# Patient Record
Sex: Female | Born: 2000 | Race: Black or African American | Hispanic: No | Marital: Single | Smoking: Current every day smoker
Health system: Southern US, Community
[De-identification: ages and names within clinical notes are randomized; demographics above are authoritative.]

## PROBLEM LIST (undated history)

## (undated) DIAGNOSIS — R4689 Other symptoms and signs involving appearance and behavior: Secondary | ICD-10-CM

## (undated) DIAGNOSIS — Z789 Other specified health status: Secondary | ICD-10-CM

## (undated) HISTORY — PX: NO PAST SURGERIES: SHX2092

---
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
## 2019-05-14 ENCOUNTER — Emergency Department (HOSPITAL_COMMUNITY)
Admission: EM | Admit: 2019-05-14 | Discharge: 2019-05-14 | Disposition: A | Discharge status: Home / Self Care | Payer: Medicaid Other | Attending: Emergency Medicine | Admitting: Emergency Medicine

## 2019-05-14 ENCOUNTER — Encounter (HOSPITAL_COMMUNITY): Payer: Self-pay

## 2019-05-14 DIAGNOSIS — Z79899 Other long term (current) drug therapy: Secondary | ICD-10-CM | POA: Diagnosis not present

## 2019-05-14 DIAGNOSIS — J020 Streptococcal pharyngitis: Secondary | ICD-10-CM | POA: Diagnosis not present

## 2019-05-14 DIAGNOSIS — R509 Fever, unspecified: Secondary | ICD-10-CM | POA: Diagnosis present

## 2019-05-14 MED ORDER — DEXAMETHASONE 4 MG PO TABS
10.0000 mg | ORAL_TABLET | Freq: Once | ORAL | Status: AC
  Administered 2019-05-14: 10 mg via ORAL
  Filled 2019-05-14: qty 2

## 2019-05-14 MED ORDER — PENICILLIN G BENZATHINE 1200000 UNIT/2ML IM SUSP
1.2000 10*6.[IU] | Freq: Once | INTRAMUSCULAR | Status: AC
  Administered 2019-05-14: 1.2 10*6.[IU] via INTRAMUSCULAR
  Filled 2019-05-14: qty 2

## 2019-05-14 NOTE — Discharge Instructions (Signed)
Please follow-up with an ear nose and throat doctor if you have had this issue going on for many months and occurs every couple weeks.  They may decide to take her tonsils out or may think this is caused by something else.

## 2019-05-14 NOTE — ED Triage Notes (Signed)
Pt states she's had a fever since yesterday, the highest she states was 105, pt says that she took tylenol prior to coming to the ED

## 2019-05-14 NOTE — ED Provider Notes (Signed)
[facility] DEPT Provider Note   CSN: [id number] Arrival date & time: 05/14/19  0122    History   Chief Complaint Chief Complaint  Patient presents with  . Fever    HPI Brianna Lloyd is a 18 y.o. female.     18 yo F with a cc of sore throat.  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  States that she gets a severe infection about every 2 weeks.  Gets to the point where she needs to seek medical care about twice a year.  Had fever at home as high as 105.  Took Tylenol with some improvement.  Denies anyone nearby with similar illness.  Denies cough or congestion.  Denies vomiting or diarrhea.  Feels worse on the left than the right.  The history is provided by the patient.  Fever Associated symptoms: no chest pain, no chills, no congestion, no dysuria, no headaches, no myalgias, no nausea, no rhinorrhea and no vomiting   Illness Severity:  Moderate Onset quality:  Gradual Duration:  2 days Timing:  Constant Progression:  Worsening Chronicity:  Recurrent Associated symptoms: fever   Associated symptoms: no chest pain, no congestion, no headaches, no myalgias, no nausea, no rhinorrhea, no shortness of breath, no vomiting and no wheezing     History reviewed. No pertinent past medical history.  Patient Active Problem List   Diagnosis Date Noted  . Aggressive behavior of adolescent   . PTSD (post-traumatic stress disorder) 01/20/2016    History reviewed. No pertinent surgical history.   OB History   No obstetric history on file.      Home Medications    Prior to Admission medications   Medication Sig Start Date End Date Taking? Authorizing Provider  acetaminophen (TYLENOL) 325 MG tablet Take 2 tablets (650 mg total) by mouth every 4 (four) hours as needed for mild pain or moderate pain. 02/03/16   Orlie Dakin, MD  ammonium lactate (AMLACTIN) 12 % lotion Apply 1 application topically as needed for dry  skin. 02/03/16   Orlie Dakin, MD  amphetamine-dextroamphetamine (ADDERALL XR) 15 MG 24 hr capsule Take 1 capsule by mouth daily with breakfast. 02/03/16   Orlie Dakin, MD  calcium & magnesium carbonates (MYLANTA) 601-093 MG tablet Take 1 tablet by mouth 2 (two) times daily as needed for heartburn. 02/03/16   Orlie Dakin, MD  cetirizine (ZYRTEC) 10 MG tablet Take 10 mg by mouth daily.    [provider]  Cholecalciferol (VITAMIN D3) 2000 units capsule Take 2,000 Units by mouth daily.    [provider]  FLUoxetine (PROZAC) 20 MG capsule Take 1 capsule (20 mg total) by mouth daily. 02/03/16   Orlie Dakin, MD  Halobetasol-Ammonium Lactate 0.05 & 12 % (Cream) KIT Apply 1 application topically 2 (two) times daily.    [provider]  lidocaine (XYLOCAINE) 2 % solution Use as directed 15 mLs in the mouth or throat as needed for mouth pain. 06/19/18   Larene Pickett, PA-C  risperiDONE (RISPERDAL) 0.5 MG tablet Take 1 tablet (0.5 mg total) by mouth daily. 02/03/16   Orlie Dakin, MD  tretinoin (RETIN-A) 0.025 % cream Apply 1 application topically at bedtime.    [provider]  triamcinolone cream (KENALOG) 0.1 % Apply 1 application topically 2 (two) times daily.    [provider]    Family History History reviewed. No pertinent family history.  Social History Social History   Tobacco Use  .  Smoking status: Never Smoker  . Smokeless tobacco: Never Used  Substance Use Topics  . Alcohol use: Yes  . Drug use: Yes    Types: Marijuana     Allergies   Patient has no known allergies.   Review of Systems Review of Systems  Constitutional: Positive for fever. Negative for chills.  HENT: Negative for congestion and rhinorrhea.   Eyes: Negative for redness and visual disturbance.  Respiratory: Negative for shortness of breath and wheezing.   Cardiovascular: Negative for chest pain and palpitations.  Gastrointestinal: Negative for nausea  and vomiting.  Genitourinary: Negative for dysuria and urgency.  Musculoskeletal: Negative for arthralgias and myalgias.  Skin: Negative for pallor and wound.  Neurological: Negative for dizziness and headaches.     Physical Exam Updated Vital Signs BP 118/74 (BP Location: Left Arm)   Pulse 89   Temp (!) 100.9 F (38.3 C) (Oral)   Resp 15   Ht 5' (1.524 m)   Wt 59 kg   SpO2 100%   BMI 25.39 kg/m   Physical Exam Vitals signs and nursing note reviewed.  Constitutional:      General: She is not in acute distress.    Appearance: She is well-developed. She is not diaphoretic.  HENT:     Head: Normocephalic and atraumatic.     Comments: Tonsillar swelling and exudates worse on the right than the left.  Left anterior cervical lymphadenopathy.  Tolerating secretions without difficulty.  No significant posterior oropharyngeal erythema or edema.  Epiglottis is visualized. Eyes:     Pupils: Pupils are equal, round, and reactive to light.  Neck:     Musculoskeletal: Normal range of motion and neck supple.  Cardiovascular:     Rate and Rhythm: Normal rate and regular rhythm.     Heart sounds: No murmur. No friction rub. No gallop.   Pulmonary:     Effort: Pulmonary effort is normal.     Breath sounds: No wheezing or rales.  Abdominal:     General: There is no distension.     Palpations: Abdomen is soft.     Tenderness: There is no abdominal tenderness.  Musculoskeletal:        General: No tenderness.  Skin:    General: Skin is warm and dry.  Neurological:     Mental Status: She is alert and oriented to person, place, and time.  Psychiatric:        Behavior: Behavior normal.      ED Treatments / Results  Labs (all labs ordered are listed, but only abnormal results are displayed) Labs Reviewed - No data to display  EKG None  Radiology No results found.  Procedures Procedures (including critical care time)  Medications Ordered in ED Medications  penicillin g  benzathine (BICILLIN LA) 1200000 UNIT/2ML injection 1.2 Million Units (has no administration in time range)  dexamethasone (DECADRON) tablet 10 mg (has no administration in time range)     Initial Impression / Assessment and Plan / ED Course  I have reviewed the triage vital signs and the nursing notes.  Pertinent labs & imaging results that were available during my care of the patient were reviewed by me and considered in my medical decision making (see chart for details).        18 yo F with a chief complaint of a fever.  Patient has recurrent episodes where she gets a sore throat and a fever.  She states that she seeks medical care about twice a year  but about every 2 weeks has pretty significant pain with it.  Has never seen an ear nose and throat physician.  I will treat for strep pharyngitis as the patient has 3/4 centor criteria.  Given ENT follow-up as needed.  1:59 AM:  I have discussed the diagnosis/risks/treatment options with the patient and believe the pt to be eligible for discharge home to follow-up with ENT, PCP. We also discussed returning to the ED immediately if new or worsening sx occur. We discussed the sx which are most concerning (e.g., sudden worsening pain, fever, inability to tolerate by mouth) that necessitate immediate return. Medications administered to the patient during their visit and any new prescriptions provided to the patient are listed below.  Medications given during this visit Medications  penicillin g benzathine (BICILLIN LA) 1200000 UNIT/2ML injection 1.2 Million Units (has no administration in time range)  dexamethasone (DECADRON) tablet 10 mg (has no administration in time range)     The patient appears reasonably screen and/or stabilized for discharge and I doubt any other medical condition or other [facility] requiring further screening, evaluation, or treatment in the ED at this time prior to discharge.    Final Clinical Impressions(s) / ED Diagnoses    Final diagnoses:  Strep pharyngitis    ED Discharge Orders    None       Deno Etienne, DO 05/14/19 0159

## 2019-11-03 ENCOUNTER — Other Ambulatory Visit: Payer: Self-pay | Admitting: *Deleted

## 2019-11-03 DIAGNOSIS — Z20822 Contact with and (suspected) exposure to covid-19: Secondary | ICD-10-CM

## 2019-11-04 LAB — NOVEL CORONAVIRUS, NAA: SARS-CoV-2, NAA: NOT DETECTED

## 2019-12-06 ENCOUNTER — Encounter: Payer: Medicaid Other | Admitting: Advanced Practice Midwife

## 2019-12-12 ENCOUNTER — Other Ambulatory Visit: Payer: Self-pay

## 2019-12-12 ENCOUNTER — Encounter: Payer: Self-pay | Admitting: Emergency Medicine

## 2019-12-12 ENCOUNTER — Emergency Department
Admission: EM | Admit: 2019-12-12 | Discharge: 2019-12-12 | Disposition: A | Discharge status: Home / Self Care | Payer: Medicaid Other | Attending: Emergency Medicine | Admitting: Emergency Medicine

## 2019-12-12 DIAGNOSIS — U071 COVID-19: Secondary | ICD-10-CM | POA: Diagnosis not present

## 2019-12-12 DIAGNOSIS — Z79899 Other long term (current) drug therapy: Secondary | ICD-10-CM | POA: Diagnosis not present

## 2019-12-12 DIAGNOSIS — Z1152 Encounter for screening for COVID-19: Secondary | ICD-10-CM

## 2019-12-12 DIAGNOSIS — R430 Anosmia: Secondary | ICD-10-CM | POA: Diagnosis present

## 2019-12-12 NOTE — ED Triage Notes (Addendum)
States she needs COVID testing  States she has lost her taste  No fever or other sx's

## 2019-12-12 NOTE — ED Notes (Signed)
Topaz pad not working. Pt verbalized understanding discharge instructions and follow ups. Pt ambulated to lobby in no distress.

## 2019-12-12 NOTE — ED Provider Notes (Signed)
[facility] Emergency Department Provider Note  ____________________________________________  Time seen: Approximately 4:36 PM  I have reviewed the triage vital signs and the nursing notes.   HISTORY  Chief Complaint URI (wants COVID testing)    HPI Brianna Lloyd is a 19 y.o. female that presents to the emergency department requesting Covid testing.  Patient states that she noticed that she could not smell or taste today.  She denies any additional symptoms.  Patient states that she has an occasional cough which is usual for her because she smokes marijuana.  No contacts with COVID-19.  She feels well overall.  No fever, nasal congestion, sore throat, vomiting, diarrhea.   History reviewed. No pertinent past medical history.  Patient Active Problem List   Diagnosis Date Noted  . Aggressive behavior of adolescent   . PTSD (post-traumatic stress disorder) 01/20/2016    History reviewed. No pertinent surgical history.  Prior to Admission medications   Medication Sig Start Date End Date Taking? Authorizing Provider  acetaminophen (TYLENOL) 325 MG tablet Take 2 tablets (650 mg total) by mouth every 4 (four) hours as needed for mild pain or moderate pain. 02/03/16   Orlie Dakin, MD  ammonium lactate (AMLACTIN) 12 % lotion Apply 1 application topically as needed for dry skin. 02/03/16   Orlie Dakin, MD  amphetamine-dextroamphetamine (ADDERALL XR) 15 MG 24 hr capsule Take 1 capsule by mouth daily with breakfast. 02/03/16   Orlie Dakin, MD  calcium & magnesium carbonates (MYLANTA) 157-262 MG tablet Take 1 tablet by mouth 2 (two) times daily as needed for heartburn. 02/03/16   Orlie Dakin, MD  cetirizine (ZYRTEC) 10 MG tablet Take 10 mg by mouth daily.    [provider]  Cholecalciferol (VITAMIN D3) 2000 units capsule Take 2,000 Units by mouth daily.    [provider]  FLUoxetine (PROZAC) 20 MG capsule Take 1 capsule (20 mg total) by  mouth daily. 02/03/16   Orlie Dakin, MD  Halobetasol-Ammonium Lactate 0.05 & 12 % (Cream) KIT Apply 1 application topically 2 (two) times daily.    [provider]  lidocaine (XYLOCAINE) 2 % solution Use as directed 15 mLs in the mouth or throat as needed for mouth pain. 06/19/18   Larene Pickett, PA-C  risperiDONE (RISPERDAL) 0.5 MG tablet Take 1 tablet (0.5 mg total) by mouth daily. 02/03/16   Orlie Dakin, MD  tretinoin (RETIN-A) 0.025 % cream Apply 1 application topically at bedtime.    [provider]  triamcinolone cream (KENALOG) 0.1 % Apply 1 application topically 2 (two) times daily.    [provider]    Allergies Patient has no known allergies.  No family history on file.  Social History Social History   Tobacco Use  . Smoking status: Never Smoker  . Smokeless tobacco: Never Used  Substance Use Topics  . Alcohol use: Yes  . Drug use: Yes    Types: Marijuana     Review of Systems  Constitutional: No fever/chills ENT: No upper respiratory complaints. Cardiovascular: No chest pain. Respiratory: No new cough. No SOB. Gastrointestinal: No abdominal pain.  No nausea, no vomiting.  Musculoskeletal: Negative for musculoskeletal pain. Skin: Negative for rash, abrasions, lacerations, ecchymosis. Neurological: Negative for headaches   ____________________________________________   PHYSICAL EXAM:  VITAL SIGNS: ED Triage Vitals [12/12/19 1616]  Enc Vitals Group     BP      Pulse      Resp      Temp  Temp src      SpO2      Weight      Height      Head Circumference      Peak Flow      Pain Score 0     Pain Loc      Pain Edu?      Excl. in [place]?      Constitutional: Alert and oriented. Well appearing and in no acute distress. Eyes: Conjunctivae are normal. PERRL. EOMI. Head: Atraumatic. ENT:      Ears:      Nose: No congestion/rhinnorhea.      Mouth/Throat: Mucous membranes are moist.  Neck: No  stridor. Cardiovascular: Normal rate, regular rhythm.  Good peripheral circulation. Respiratory: Normal respiratory effort without tachypnea or retractions. Lungs CTAB. Good air entry to the bases with no decreased or absent breath sounds. Musculoskeletal: Full range of motion to all extremities. No gross deformities appreciated. Neurologic:  Normal speech and language. No gross focal neurologic deficits are appreciated.  Skin:  Skin is warm, dry and intact. No rash noted. Psychiatric: Mood and affect are normal. Speech and behavior are normal. Patient exhibits appropriate insight and judgement.   ____________________________________________   LABS (all labs ordered are listed, but only abnormal results are displayed)  Labs Reviewed  SARS CORONAVIRUS 2 (TAT 6-24 HRS)   ____________________________________________  EKG   ____________________________________________  RADIOLOGY  No results found.  ____________________________________________    PROCEDURES  Procedure(s) performed:    Procedures    Medications - No data to display   ____________________________________________   INITIAL IMPRESSION / ASSESSMENT AND PLAN / ED COURSE  Pertinent labs & imaging results that were available during my care of the patient were reviewed by me and considered in my medical decision making (see chart for details).  Review of the [place] CSRS was performed in accordance of the [place] prior to dispensing any controlled drugs.     Patient presented to the emergency department for Covid screening.  Vital signs and exam are reassuring.  Covid test is pending.   Patient is to follow up with primary care as directed. Patient is given ED precautions to return to the ED for any worsening or new symptoms.  Heleena Bromwell was evaluated in Emergency Department on 12/12/2019 for the symptoms described in the history of present illness. She was evaluated in the context of the global COVID-19  pandemic, which necessitated consideration that the patient might be at risk for infection with the SARS-CoV-2 virus that causes COVID-19. Institutional protocols and algorithms that pertain to the evaluation of patients at risk for COVID-19 are in a state of rapid change based on information released by regulatory bodies including the CDC and federal and state organizations. These policies and algorithms were followed during the patient's care in the ED.   ____________________________________________  FINAL CLINICAL IMPRESSION(S) / ED DIAGNOSES  Final diagnoses:  Encounter for screening for COVID-19      NEW MEDICATIONS STARTED DURING THIS VISIT:  ED Discharge Orders    None          This chart was dictated using voice recognition software/Dragon. Despite best efforts to proofread, errors can occur which can change the meaning. Any change was purely unintentional.    Laban Emperor, PA-C 12/12/19 1743    Harvest Dark, MD 12/12/19 [phone]

## 2019-12-13 ENCOUNTER — Telehealth: Payer: Self-pay | Admitting: *Deleted

## 2019-12-13 LAB — SARS CORONAVIRUS 2 (TAT 6-24 HRS): SARS Coronavirus 2: POSITIVE — AB

## 2019-12-13 NOTE — Telephone Encounter (Signed)
Pt given result of COVID test obtained 2 /23/21 at [facility]; tier or symptoms reviewed; instructions given to pt . remain in self-quarantine until they meet the "Non-Test Criteria for Ending Self-Isolation". Non-Test Criteria for Ending Self-Isolation All persons with fever and respiratory symptoms should isolate themselves until ALL conditions listed below are met: - at least 10 days since symptoms onset - AND 3 consecutive days fever free without antipyretics (acetaminophen [Tylenol] or ibuprofen [Advil]) - AND improvement in respiratory symptoms . If the patient develops respiratory issues/distress, seek medical care in the Emergency Department, call 911, reports symptoms and report COVID-19 positive test. Patient Instructions . continue to utilize over-the-counter medications for fever (ibuprofen and/or Tylenol) and cough (cough medicine and/or sore throat lozenges). .  wear a mask around people and follow good infection prevention techniques. . Patient to should only leave home to seek medical care. . send family for food, prescriptions or medicines; or use delivery service.  . If the patient must leave the home, they must wear a mask in public. Marland Kitchen limit contact with immediate family members or caregivers in the home, and use mask, social distancing, and handwashing to decrease risk to patients. o Please continue good preventive care measures, including frequent hand washing, avoid touching your face, cover coughs/sneezes with tissue or into elbow, stay out of crowds and keep a 6-foot distance from others.   . patient and family to clean hard surfaces touched by patient frequently with household cleaning products. Pt advised to notify her PCP; pt also informed the [facility] Dept was notified; she verbalized understanding.

## 2020-01-04 ENCOUNTER — Encounter: Payer: Medicaid Other | Admitting: Advanced Practice Midwife

## 2020-03-25 ENCOUNTER — Telehealth: Payer: Medicaid Other

## 2020-05-23 ENCOUNTER — Ambulatory Visit
Admission: EM | Admit: 2020-05-23 | Discharge: 2020-05-23 | Disposition: A | Discharge status: Home / Self Care | Payer: Medicaid Other | Attending: Emergency Medicine | Admitting: Emergency Medicine

## 2020-05-23 ENCOUNTER — Other Ambulatory Visit: Payer: Self-pay

## 2020-05-23 DIAGNOSIS — Z3490 Encounter for supervision of normal pregnancy, unspecified, unspecified trimester: Secondary | ICD-10-CM

## 2020-05-23 DIAGNOSIS — R102 Pelvic and perineal pain unspecified side: Secondary | ICD-10-CM

## 2020-05-23 DIAGNOSIS — B373 Candidiasis of vulva and vagina: Secondary | ICD-10-CM | POA: Insufficient documentation

## 2020-05-23 DIAGNOSIS — B3731 Acute candidiasis of vulva and vagina: Secondary | ICD-10-CM

## 2020-05-23 DIAGNOSIS — R112 Nausea with vomiting, unspecified: Secondary | ICD-10-CM | POA: Diagnosis not present

## 2020-05-23 LAB — CBC WITH DIFFERENTIAL/PLATELET
Abs Immature Granulocytes: 0.02 10*3/uL (ref 0.00–0.07)
Basophils Absolute: 0 10*3/uL (ref 0.0–0.1)
Basophils Relative: 0 %
Eosinophils Absolute: 0 10*3/uL (ref 0.0–0.5)
Eosinophils Relative: 0 %
HCT: 38.8 % (ref 36.0–46.0)
Hemoglobin: 12.6 g/dL (ref 12.0–15.0)
Immature Granulocytes: 0 %
Lymphocytes Relative: 24 %
Lymphs Abs: 1.5 10*3/uL (ref 0.7–4.0)
MCH: 29.3 pg (ref 26.0–34.0)
MCHC: 32.5 g/dL (ref 30.0–36.0)
MCV: 90.2 fL (ref 80.0–100.0)
Monocytes Absolute: 0.4 10*3/uL (ref 0.1–1.0)
Monocytes Relative: 6 %
Neutro Abs: 4.4 10*3/uL (ref 1.7–7.7)
Neutrophils Relative %: 70 %
Platelets: 357 10*3/uL (ref 150–400)
RBC: 4.3 MIL/uL (ref 3.87–5.11)
RDW: 12.6 % (ref 11.5–15.5)
WBC: 6.3 10*3/uL (ref 4.0–10.5)
nRBC: 0 % (ref 0.0–0.2)

## 2020-05-23 LAB — COMPREHENSIVE METABOLIC PANEL
ALT: 16 U/L (ref 0–44)
AST: 20 U/L (ref 15–41)
Albumin: 4.8 g/dL (ref 3.5–5.0)
Alkaline Phosphatase: 65 U/L (ref 38–126)
Anion gap: 13 (ref 5–15)
BUN: 11 mg/dL (ref 6–20)
CO2: 22 mmol/L (ref 22–32)
Calcium: 9.7 mg/dL (ref 8.9–10.3)
Chloride: 98 mmol/L (ref 98–111)
Creatinine, Ser: 0.64 mg/dL (ref 0.44–1.00)
GFR calc Af Amer: >60 mL/min (ref >60)
GFR calc non Af Amer: >60 mL/min (ref >60)
Glucose, Bld: 94 mg/dL (ref 70–99)
Potassium: 3.8 mmol/L (ref 3.5–5.1)
Sodium: 133 mmol/L — ABNORMAL LOW (ref 135–145)
Total Bilirubin: 0.8 mg/dL (ref 0.3–1.2)
Total Protein: 8.7 g/dL — ABNORMAL HIGH (ref 6.5–8.1)

## 2020-05-23 LAB — PREGNANCY, URINE: Preg Test, Ur: POSITIVE — AB

## 2020-05-23 LAB — WET PREP, GENITAL
Clue Cells Wet Prep HPF POC: NONE SEEN
Sperm: NONE SEEN
Trich, Wet Prep: NONE SEEN

## 2020-05-23 LAB — URINALYSIS, COMPLETE (UACMP) WITH MICROSCOPIC
Glucose, UA: NEGATIVE mg/dL
Ketones, ur: >160 mg/dL — AB
Leukocytes,Ua: NEGATIVE
Nitrite: NEGATIVE
Protein, ur: 100 mg/dL — AB
Specific Gravity, Urine: >1.03 — ABNORMAL HIGH (ref 1.005–1.030)
pH: 6 (ref 5.0–8.0)

## 2020-05-23 LAB — LIPASE, BLOOD: Lipase: 37 U/L (ref 11–51)

## 2020-05-23 LAB — HCG, QUANTITATIVE, PREGNANCY: hCG, Beta Chain, Quant, S: 44599 m[IU]/mL — ABNORMAL HIGH (ref <5)

## 2020-05-23 NOTE — ED Triage Notes (Signed)
Patient complains of nausea and vomiting x 3 days with chills, fatigue and body aches. Patient states that she is unable to keep anything down.

## 2020-05-23 NOTE — ED Provider Notes (Signed)
HPI  SUBJECTIVE:  Brianna Lloyd is a 19 y.o. female who presents with 3 days of nausea, vomiting starting after a night of heavy drinking.  Reports 3 episodes of nonbilious nonbloody emesis for the past several days, states that she is keeping fluids down, but is unable to tolerate any food.  She reports generalized weakness and low abdominal/pelvic pain described as throbbing, stabbing, sore and constant.  It has not changed since it started 3 days ago.  She reports 1-2 episodes of watery, nonbloody diarrhea per day.  No fevers.  She reports cloudy urine, body aches.  She tried Pepto-Bismol and eating.  Symptoms are better after vomiting and having a bowel movement, with lying down still, symptoms are worse before vomiting or having a bowel movement and with moving around.  No change in urine output, dysuria, urinary urgency, frequency, odorous urine, hematuria.  No back pain.  No vaginal odor, bleeding, discharge.  She is in a monogamous relationship with a female, however STDs are a concern today.  She had Covid earlier this year.  She smokes marijuana daily, but has not smoked in 3 days.  She has a history of UTI, HSV.  No history of diabetes, hypertension, alcohol abuse, pancreatitis, pyelonephritis, gonorrhea, chlamydia, HIV, syphilis, trichomonas, BV, yeast, PID.  She has never been pregnant.  LMP: Late June.  States that she missed July.  She is normally very regular.  PMD: None.    History reviewed. No pertinent past medical history.  Past Surgical History:  Procedure Laterality Date  . NO PAST SURGERIES      Family History  Problem Relation Age of Onset  . Schizophrenia Mother   . Alcohol abuse Mother   . Schizophrenia Father   . Alcohol abuse Father     Social History   Tobacco Use  . Smoking status: Never Smoker  . Smokeless tobacco: Never Used  Vaping Use  . Vaping Use: Every day  Substance Use Topics  . Alcohol use: Yes  . Drug use: Yes    Types: Marijuana    No  current facility-administered medications for this encounter.  Current Outpatient Medications:  .  acetaminophen (TYLENOL) 325 MG tablet, Take 2 tablets (650 mg total) by mouth every 4 (four) hours as needed for mild pain or moderate pain., Disp: 100 tablet, Rfl: 0 .  ammonium lactate (AMLACTIN) 12 % lotion, Apply 1 application topically as needed for dry skin., Disp: 400 g, Rfl: 0 .  calcium & magnesium carbonates (MYLANTA) 311-232 MG tablet, Take 1 tablet by mouth 2 (two) times daily as needed for heartburn., Disp: 60 tablet, Rfl: 0 .  cetirizine (ZYRTEC) 10 MG tablet, Take 10 mg by mouth daily., Disp: , Rfl:  .  Cholecalciferol (VITAMIN D3) 2000 units capsule, Take 2,000 Units by mouth daily., Disp: , Rfl:  .  FLUoxetine (PROZAC) 20 MG capsule, Take 1 capsule (20 mg total) by mouth daily., Disp: 30 capsule, Rfl: 0 .  Halobetasol-Ammonium Lactate 0.05 & 12 % (Cream) KIT, Apply 1 application topically 2 (two) times daily., Disp: , Rfl:  .  risperiDONE (RISPERDAL) 0.5 MG tablet, Take 1 tablet (0.5 mg total) by mouth daily., Disp: 30 tablet, Rfl: 0 .  tretinoin (RETIN-A) 0.025 % cream, Apply 1 application topically at bedtime., Disp: , Rfl:  .  triamcinolone cream (KENALOG) 0.1 %, Apply 1 application topically 2 (two) times daily., Disp: , Rfl:   No Known Allergies   ROS  As noted in HPI.   Physical  Exam  BP 118/71 (BP Location: Left Arm)   Pulse 74   Temp 98.2 F (36.8 C) (Oral)   Resp 17   Ht 5' (1.524 m)   Wt 49.9 kg   SpO2 100%   BMI 21.48 kg/m   Constitutional: Well developed, well nourished, no acute distress Eyes:  EOMI, conjunctiva normal bilaterally HENT: Normocephalic, atraumatic,mucus membranes moist Respiratory: Normal inspiratory effort, lungs clear bilaterally Cardiovascular: Normal rate regular rhythm, no murmurs rubs or gallops GI: Normal appearance, soft.  Positive right lower quadrant, left lower quadrant, suprapubic tenderness.  No other abdominal tenderness.   Active bowel sounds.  No rebound, guarding.  No distention. Back: No CVAT GU: Normal external genitalia.  Thick nonodorous white vaginal discharge.  Os closed.  No bleeding.  Positive uterine tenderness.  Positive bilateral adnexal tenderness worse on the right.  Positive CMT. skin: No rash, skin intact Musculoskeletal: no deformities Neurologic: Alert & oriented x 3, no focal neuro deficits Psychiatric: Speech and behavior appropriate  ED Course  Medications - No data to display  Orders Placed This Encounter  Procedures  . Pelvic exam    Standing Status:   Standing    Number of Occurrences:   1  . [place] rt PCR ([place] only)    Standing Status:   Standing    Number of Occurrences:   1  . Wet prep, genital    Standing Status:   Standing    Number of Occurrences:   1  . Urinalysis, Complete w Microscopic    Standing Status:   Standing    Number of Occurrences:   1  . Pregnancy, urine    Standing Status:   Standing    Number of Occurrences:   1  . Comprehensive metabolic panel    Standing Status:   Standing    Number of Occurrences:   1  . Lipase, blood    Standing Status:   Standing    Number of Occurrences:   1  . hCG, quantitative, pregnancy    Standing Status:   Standing    Number of Occurrences:   1  . CBC with Differential    Standing Status:   Standing    Number of Occurrences:   1  . RPR    Standing Status:   Standing    Number of Occurrences:   1  . HIV Antibody (routine testing w rflx)    Standing Status:   Standing    Number of Occurrences:   1    Results for orders placed or performed during the hospital encounter of 05/23/20 (from the past 24 hour(s))  Urinalysis, Complete w Microscopic Urine, Clean Catch     Status: Abnormal   Collection Time: 05/23/20  4:41 PM  Result Value Ref Range   Color, Urine YELLOW YELLOW   APPearance HAZY (A) CLEAR   Specific Gravity, Urine >1.030 (H) 1.005 - 1.030   pH 6.0 5.0 - 8.0   Glucose, UA NEGATIVE NEGATIVE  mg/dL   Hgb urine dipstick MODERATE (A) NEGATIVE   Bilirubin Urine SMALL (A) NEGATIVE   Ketones, ur >160 (A) NEGATIVE mg/dL   Protein, ur 100 (A) NEGATIVE mg/dL   Nitrite NEGATIVE NEGATIVE   Leukocytes,Ua NEGATIVE NEGATIVE   Squamous Epithelial / LPF 6-10 0 - 5   WBC, UA 6-10 0 - 5 WBC/hpf   RBC / HPF 11-20 0 - 5 RBC/hpf   Bacteria, UA FEW (A) NONE SEEN   Mucus PRESENT  Budding Yeast PRESENT   Pregnancy, urine     Status: Abnormal   Collection Time: 05/23/20  4:41 PM  Result Value Ref Range   Preg Test, Ur POSITIVE (A) NEGATIVE  Wet prep, genital     Status: Abnormal   Collection Time: 05/23/20  4:41 PM   Specimen: Cervical/Vaginal swab  Result Value Ref Range   Yeast Wet Prep HPF POC PRESENT (A) NONE SEEN   Trich, Wet Prep NONE SEEN NONE SEEN   Clue Cells Wet Prep HPF POC NONE SEEN NONE SEEN   WBC, Wet Prep HPF POC FEW (A) NONE SEEN   Sperm NONE SEEN   Comprehensive metabolic panel     Status: Abnormal   Collection Time: 05/23/20  5:42 PM  Result Value Ref Range   Sodium 133 (L) 135 - 145 mmol/L   Potassium 3.8 3.5 - 5.1 mmol/L   Chloride 98 98 - 111 mmol/L   CO2 22 22 - 32 mmol/L   Glucose, Bld 94 70 - 99 mg/dL   BUN 11 6 - 20 mg/dL   Creatinine, Ser 0.64 0.44 - 1.00 mg/dL   Calcium 9.7 8.9 - 10.3 mg/dL   Total Protein 8.7 (H) 6.5 - 8.1 g/dL   Albumin 4.8 3.5 - 5.0 g/dL   AST 20 15 - 41 U/L   ALT 16 0 - 44 U/L   Alkaline Phosphatase 65 38 - 126 U/L   Total Bilirubin 0.8 0.3 - 1.2 mg/dL   GFR calc non Af Amer >60 >60 mL/min   GFR calc Af Amer >60 >60 mL/min   Anion gap 13 5 - 15  Lipase, blood     Status: None   Collection Time: 05/23/20  5:42 PM  Result Value Ref Range   Lipase 37 11 - 51 U/L  hCG, quantitative, pregnancy     Status: Abnormal   Collection Time: 05/23/20  5:42 PM  Result Value Ref Range   hCG, Beta Chain, Quant, S 44,599 (H) <5 mIU/mL  CBC with Differential     Status: None   Collection Time: 05/23/20  5:42 PM  Result Value Ref Range   WBC  6.3 4.0 - 10.5 K/uL   RBC 4.30 3.87 - 5.11 MIL/uL   Hemoglobin 12.6 12.0 - 15.0 g/dL   HCT 38.8 36 - 46 %   MCV 90.2 80.0 - 100.0 fL   MCH 29.3 26.0 - 34.0 pg   MCHC 32.5 30.0 - 36.0 g/dL   RDW 12.6 11.5 - 15.5 %   Platelets 357 150 - 400 K/uL   nRBC 0.0 0.0 - 0.2 %   Neutrophils Relative % 70 %   Neutro Abs 4.4 1.7 - 7.7 K/uL   Lymphocytes Relative 24 %   Lymphs Abs 1.5 0.7 - 4.0 K/uL   Monocytes Relative 6 %   Monocytes Absolute 0.4 0 - 1 K/uL   Eosinophils Relative 0 %   Eosinophils Absolute 0.0 0 - 0 K/uL   Basophils Relative 0 %   Basophils Absolute 0.0 0 - 0 K/uL   Immature Granulocytes 0 %   Abs Immature Granulocytes 0.02 0.00 - 0.07 K/uL   No results found.  ED Clinical Impression  1. Pregnancy, unspecified gestational age   8. Pelvic pain   3. Nausea and vomiting, intractability of vomiting not specified, unspecified vomiting type   4. Vaginal yeast infection      ED Assessment/Plan  She is pregnant, of unknown duration, at least in the first trimester.  I am concerned that she has PID based on physical exam findings.  She has bilateral adnexal tenderness, and no vaginal bleeding, so think that ectopic is less likely although this is in the differential.  She does have a lot of ketones in her urine, may benefit from some IV fluids, Reglan.  Gonorrhea and chlamydia, HIV, RPR have been sent off already.  She is stable to go via private vehicle.  CBC, CMP, beta quant, lipase will be done here so that they are ready by the time she gets to the [facility] ED.  Discussed labs, medical decision-making, rationale for transfer to the emergency department.  Emphasized importance of going to the ED now.  Called the charge nurse at [facility] ED, let them know that patient is on their way and that we have initiated a laboratory work-up here.  Patient agrees to go to the ED.   CMP, lipase, CBC normal.  Beta quant 44,599.  Suspect that her symptoms are from pregnancy.  Urine culture,  gonorrhea, chlamydia, HIV, RPR pending.  No orders of the defined types were placed in this encounter.   *This clinic note was created using Dragon dictation software. Therefore, there may be occasional mistakes despite careful proofreading.   ?    Melynda Ripple, MD 05/23/20 1904

## 2020-05-23 NOTE — Discharge Instructions (Signed)
You are pregnant, but I am not sure how far along you are.  You need additional blood work and imaging to determine that.  I am concerned that you have something called pelvic inflammatory disease, also known as PID.  Go immediately to the ED for further work-up.  You may need to be admitted to the hospital if you do have PID because you are pregnant.

## 2020-05-24 ENCOUNTER — Encounter: Payer: Self-pay | Admitting: Emergency Medicine

## 2020-05-24 ENCOUNTER — Other Ambulatory Visit: Payer: Self-pay

## 2020-05-24 ENCOUNTER — Emergency Department: Payer: Medicaid Other

## 2020-05-24 ENCOUNTER — Emergency Department
Admission: EM | Admit: 2020-05-24 | Discharge: 2020-05-24 | Disposition: A | Discharge status: Home / Self Care | Payer: Medicaid Other | Attending: Emergency Medicine | Admitting: Emergency Medicine

## 2020-05-24 DIAGNOSIS — O2341 Unspecified infection of urinary tract in pregnancy, first trimester: Secondary | ICD-10-CM | POA: Insufficient documentation

## 2020-05-24 DIAGNOSIS — Z3491 Encounter for supervision of normal pregnancy, unspecified, first trimester: Secondary | ICD-10-CM

## 2020-05-24 DIAGNOSIS — Z3A01 Less than 8 weeks gestation of pregnancy: Secondary | ICD-10-CM | POA: Insufficient documentation

## 2020-05-24 DIAGNOSIS — N390 Urinary tract infection, site not specified: Secondary | ICD-10-CM

## 2020-05-24 DIAGNOSIS — O26891 Other specified pregnancy related conditions, first trimester: Secondary | ICD-10-CM | POA: Diagnosis present

## 2020-05-24 DIAGNOSIS — R1030 Lower abdominal pain, unspecified: Secondary | ICD-10-CM

## 2020-05-24 LAB — RPR: RPR Ser Ql: NONREACTIVE

## 2020-05-24 LAB — CHLAMYDIA/NGC RT PCR (ARMC ONLY)
Chlamydia Tr: NOT DETECTED
N gonorrhoeae: NOT DETECTED

## 2020-05-24 LAB — URINE CULTURE

## 2020-05-24 LAB — HIV ANTIBODY (ROUTINE TESTING W REFLEX): HIV Screen 4th Generation wRfx: NONREACTIVE

## 2020-05-24 IMAGING — US US OB < 14 WEEKS - US OB TV
1 series · 15 of 28 positions shown · non-contrast
Comparison: None.

CLINICAL DATA: Lower abdominal pain

EXAM:
OBSTETRIC <14 WK US AND TRANSVAGINAL OB US
TECHNIQUE: Both transabdominal and transvaginal ultrasound examinations were
performed for complete evaluation of the gestation as well as the
maternal uterus, adnexal regions, and pelvic cul-de-sac.
Transvaginal technique was performed to assess early pregnancy.

[Series 1: us ob < 14 weeks - us ob tv · 15 of 109 slices shown]
[im 1/109]
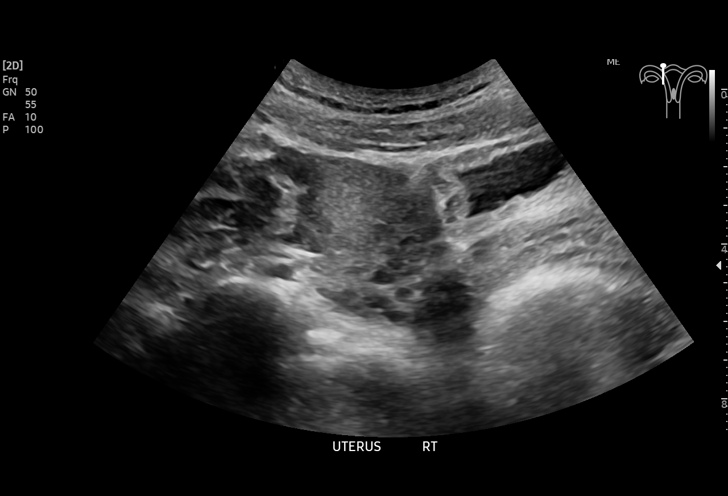
[im 9/109]
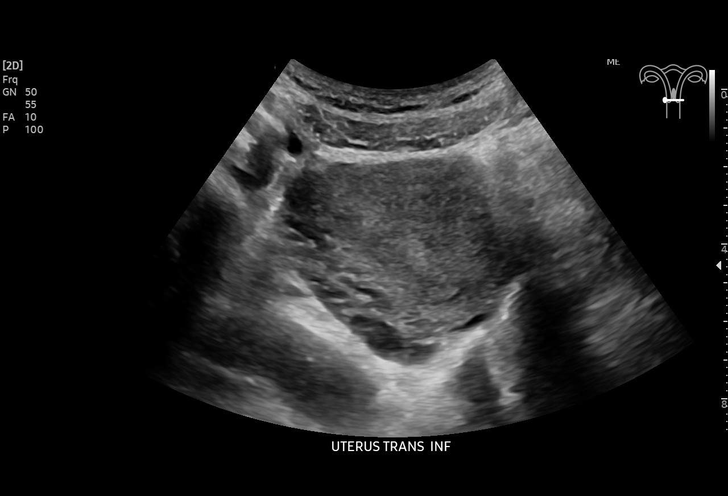
[im 17/109]
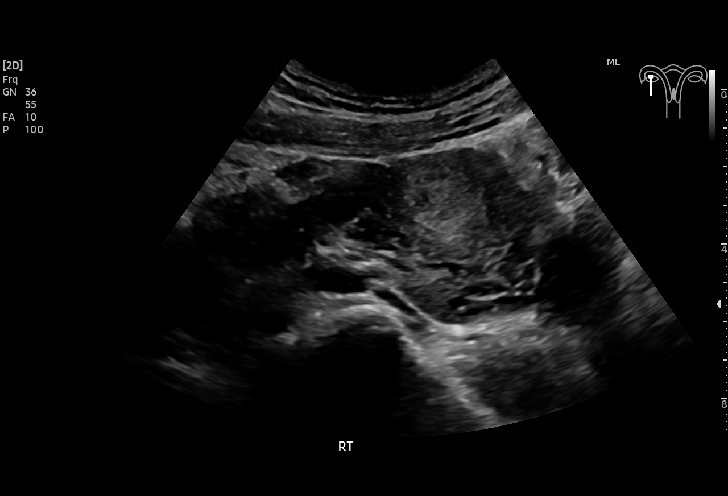
[im 25/109]
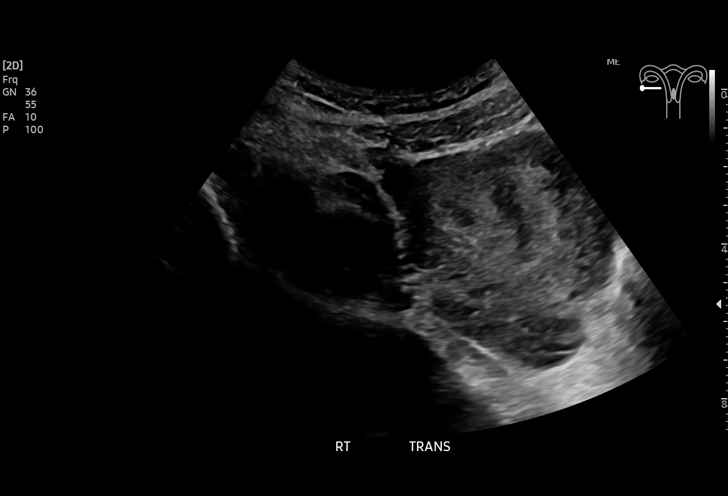
[im 33/109]
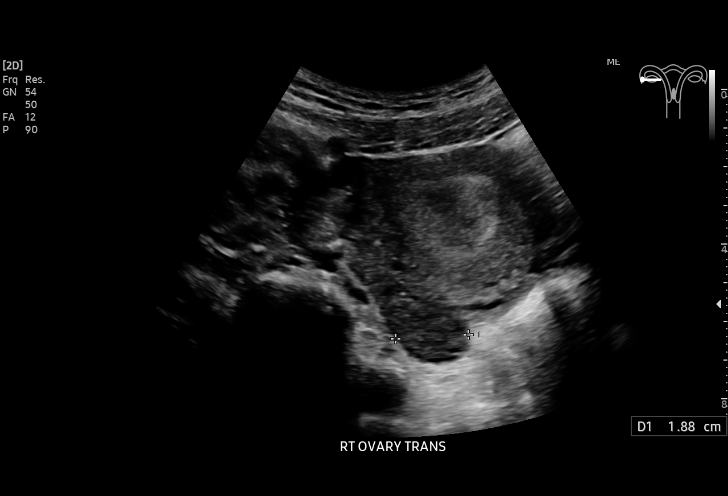
[im 41/109]
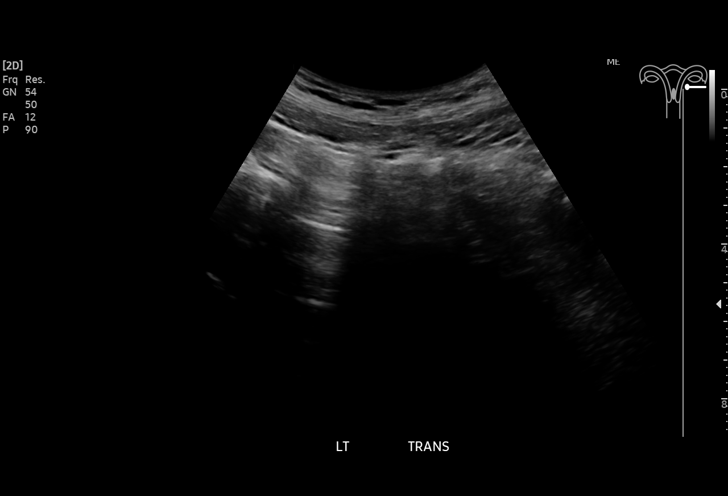
[im 49/109]
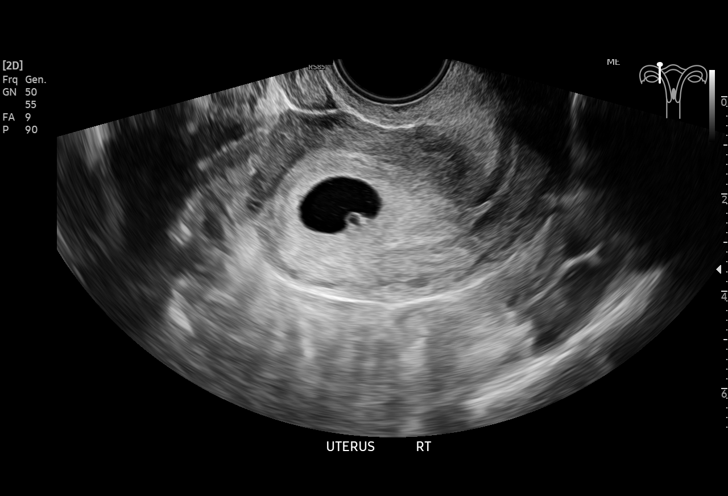
[im 57/109]
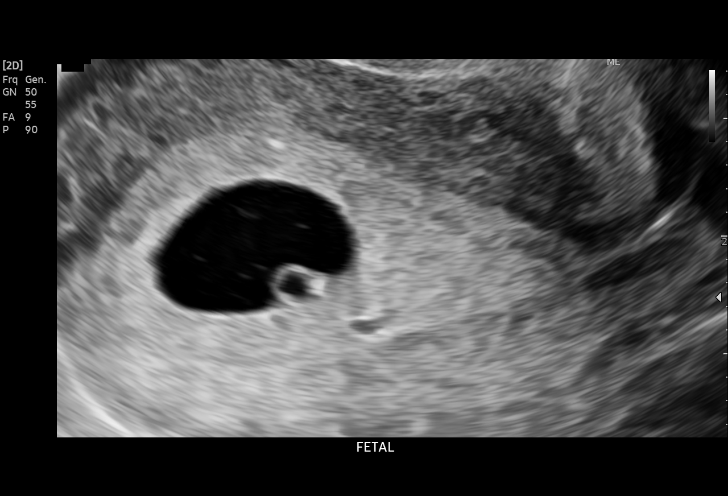
[im 61/109]
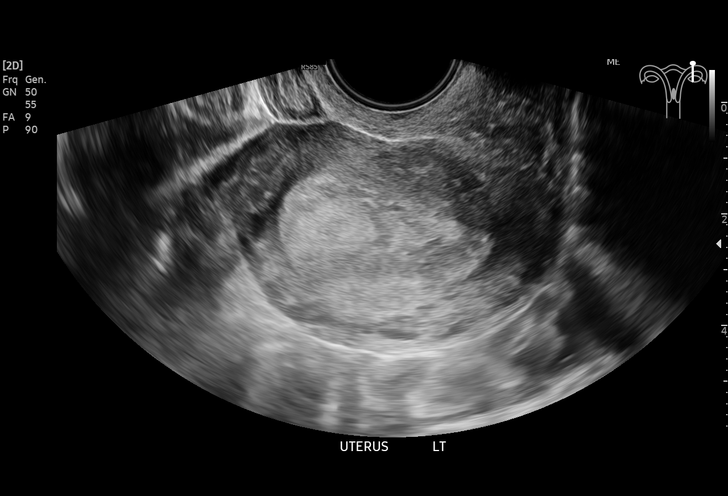
[im 69/109]
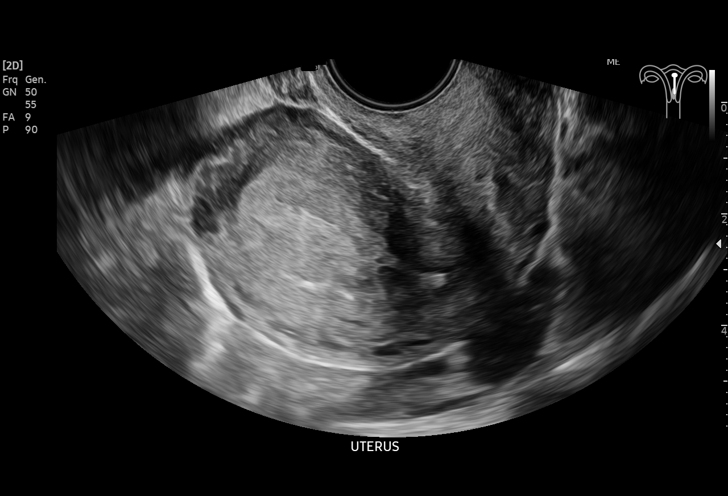
[im 77/109]
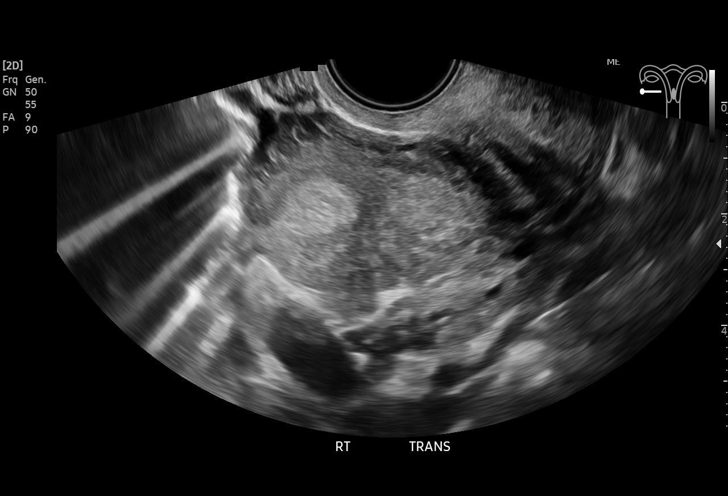
[im 85/109]
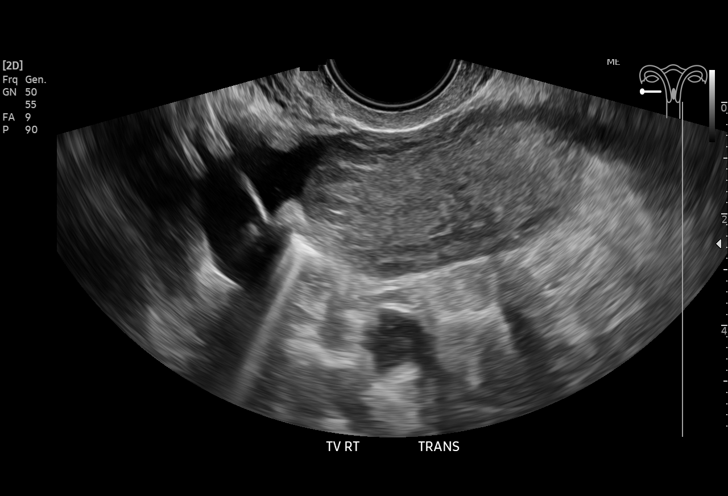
[im 93/109]
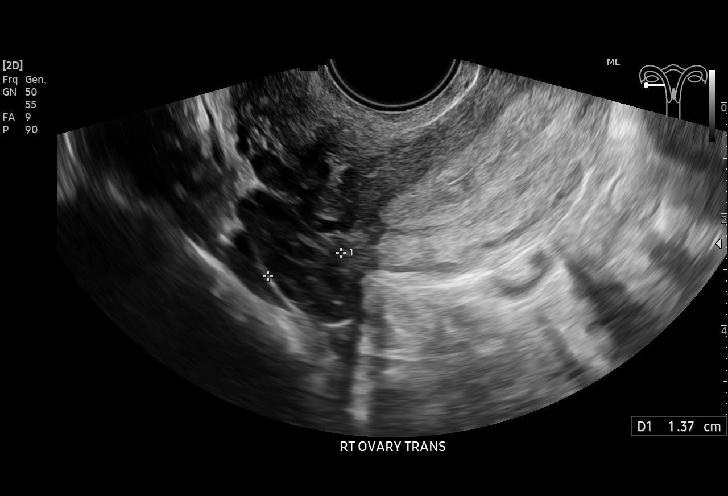
[im 101/109]
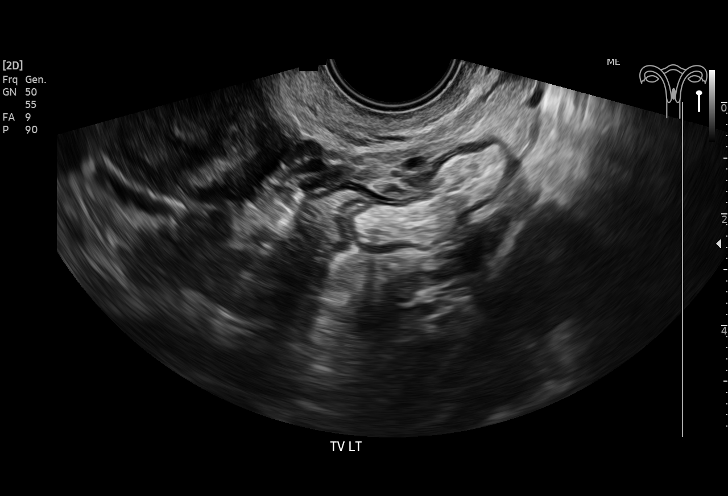
[im 109/109]
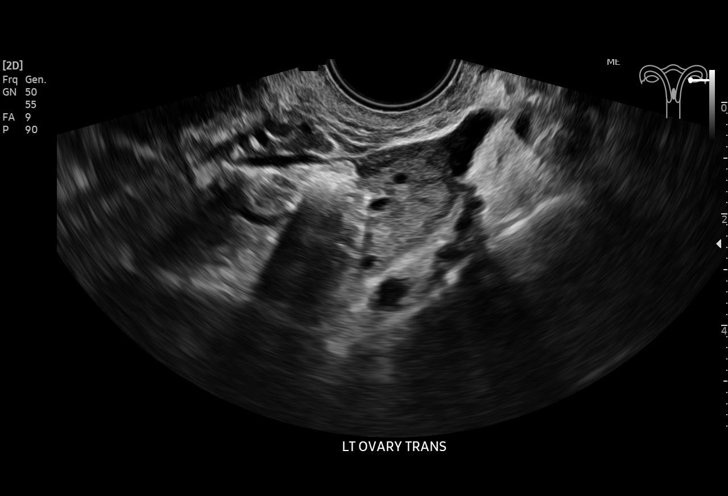

[15 of 28 positions shown; findings below may reference images not displayed]

FINDINGS: Intrauterine gestational sac: Single

Yolk sac:  Visualized.

Embryo:  Visualized.

Cardiac Activity: Visualized.

Heart Rate: 110 bpm

CRL: 2.8  mm   5 w   6 d                  US EDC: [DATE]

Subchorionic hemorrhage:  None visualized.

Maternal uterus/adnexae: Normal bilateral ovaries. Trace amount of
free fluid around the left ovary. No adnexal mass.
IMPRESSION: Single live intrauterine pregnancy as detailed above.

## 2020-05-24 MED ORDER — DOXYLAMINE-PYRIDOXINE 10-10 MG PO TBEC: DELAYED_RELEASE_TABLET | ORAL | 0 refills | Status: DC

## 2020-05-24 MED ORDER — NITROFURANTOIN MONOHYD MACRO 100 MG PO CAPS
100.0000 mg | ORAL_CAPSULE | Freq: Two times a day (BID) | ORAL | 0 refills | Status: AC
Start: 2020-05-24 — End: 2020-05-31

## 2020-05-24 NOTE — Discharge Instructions (Addendum)
Follow-up with your OB/GYN or the health department for continued treatment of your nausea with your pregnancy and also prenatal care.  The phone number for the health department is listed on your discharge papers should you decide to make an appointment for prenatal care.  The medications were sent to your pharmacy.  1 is an antibiotic for your urinary tract infection and the other is for nausea.  You will take 1 tablet at bedtime for 2 nights and then increase to 2 tablets at bedtime for nausea.  Continue to drink fluids to stay hydrated.  Return to the emergency department if any urgent concerns.

## 2020-05-24 NOTE — ED Provider Notes (Signed)
Was done  [facility] Emergency Department Provider Note   ____________________________________________   First MD Initiated Contact with Patient 05/24/20 1109     (approximate)  I have reviewed the triage vital signs and the nursing notes.   HISTORY  Chief Complaint Follow Up US   HPI Brianna Lloyd is a 19 y.o. female presents to the ED for follow-up of her ultrasound that and today.  Patient was seen in medical urgent care yesterday and patient states that she was supposed to have had the test done yesterday.   She reports that she is feeling much better today with exception of the nausea.  She has continued to drink fluids without difficulty.  She also was told she had a urinary tract infection but no antibiotics were sent to her pharmacy.  She denies any fever, chills or cough.  She rates her pain as 0/10.      History reviewed. No pertinent past medical history.  Patient Active Problem List   Diagnosis Date Noted  . Aggressive behavior of adolescent   . PTSD (post-traumatic stress disorder) 01/20/2016    Past Surgical History:  Procedure Laterality Date  . NO PAST SURGERIES      Prior to Admission medications   Medication Sig Start Date End Date Taking? Authorizing Provider  cetirizine (ZYRTEC) 10 MG tablet Take 10 mg by mouth daily.    [provider]  Cholecalciferol (VITAMIN D3) 2000 units capsule Take 2,000 Units by mouth daily.    [provider]  Doxylamine-Pyridoxine 10-10 MG TBEC 1 tablet at bedtime for 2 nights and then increase to 2 tablets at bedtime for nausea 05/24/20   Johnn Hai, PA-C  Halobetasol-Ammonium Lactate 0.05 & 12 % (Cream) KIT Apply 1 application topically 2 (two) times daily.    [provider]  nitrofurantoin, macrocrystal-monohydrate, (MACROBID) 100 MG capsule Take 1 capsule (100 mg total) by mouth 2 (two) times daily for 7 days. 05/24/20 05/31/20  Johnn Hai, PA-C  tretinoin  (RETIN-A) 0.025 % cream Apply 1 application topically at bedtime.    [provider]  triamcinolone cream (KENALOG) 0.1 % Apply 1 application topically 2 (two) times daily.    [provider]  amphetamine-dextroamphetamine (ADDERALL XR) 15 MG 24 hr capsule Take 1 capsule by mouth daily with breakfast. 02/03/16 05/23/20  Orlie Dakin, MD  FLUoxetine (PROZAC) 20 MG capsule Take 1 capsule (20 mg total) by mouth daily. 02/03/16 05/24/20  Orlie Dakin, MD  risperiDONE (RISPERDAL) 0.5 MG tablet Take 1 tablet (0.5 mg total) by mouth daily. 02/03/16 05/24/20  Orlie Dakin, MD    Allergies Patient has no known allergies.  Family History  Problem Relation Age of Onset  . Schizophrenia Mother   . Alcohol abuse Mother   . Schizophrenia Father   . Alcohol abuse Father     Social History Social History   Tobacco Use  . Smoking status: Never Smoker  . Smokeless tobacco: Never Used  Vaping Use  . Vaping Use: Every day  Substance Use Topics  . Alcohol use: Yes  . Drug use: Yes    Types: Marijuana    Review of Systems Constitutional: No fever/chills Eyes: No visual changes. ENT: No sore throat. Cardiovascular: Denies chest pain. Respiratory: Denies shortness of breath.  Negative for cough. Gastrointestinal: No abdominal pain.  Positive nausea, positive vomiting.  No diarrhea.   Genitourinary: History of urinary tract infection.  Nuys dysuria or frequency. Musculoskeletal: Negative for back pain. Skin: Negative  for rash. Neurological: Negative for headaches, focal weakness or numbness. Psychiatric:  History of PTSD ____________________________________________   PHYSICAL EXAM:  VITAL SIGNS: ED Triage Vitals  Enc Vitals Group     BP 05/24/20 1059 (!) 126/49     Pulse Rate 05/24/20 1059 73     Resp 05/24/20 1059 17     Temp 05/24/20 1059 98.3 F (36.8 C)     Temp Source 05/24/20 1059 Oral     SpO2 05/24/20 1059 100 %     Weight 05/24/20 1057 110 lb (49.9 kg)      Height 05/24/20 1057 5' (1.524 m)     Head Circumference --      Peak Flow --      Pain Score 05/24/20 1057 0     Pain Loc --      Pain Edu? --      Excl. in [place]? --     Constitutional: Alert and oriented. Well appearing and in no acute distress. Eyes: Conjunctivae are normal.  Head: Atraumatic. Neck: No stridor.   Cardiovascular: Normal rate, regular rhythm. Grossly normal heart sounds.  Good peripheral circulation. Respiratory: Normal respiratory effort.  No retractions. Lungs CTAB. Gastrointestinal: Soft and nontender. No distention.  Bowel sounds are normoactive x4 quadrants. Musculoskeletal: Moves upper and lower extremities without difficulty.  Normal gait was noted. Neurologic:  Normal speech and language. No gross focal neurologic deficits are appreciated.  Skin:  Skin is warm, dry and intact.  Psychiatric: Mood and affect are normal. Speech and behavior are normal.  ____________________________________________   LABS (all labs ordered are listed, but only abnormal results are displayed)  Labs Reviewed - No data to display  RADIOLOGY   Official radiology report(s): US OB LESS THAN 14 WEEKS WITH OB TRANSVAGINAL  Result Date: 05/24/2020 CLINICAL DATA:  Lower abdominal pain EXAM: OBSTETRIC <14 WK [place] AND TRANSVAGINAL OB US TECHNIQUE: Both transabdominal and transvaginal ultrasound examinations were performed for complete evaluation of the gestation as well as the maternal uterus, adnexal regions, and pelvic cul-de-sac. Transvaginal technique was performed to assess early pregnancy. COMPARISON:  None. FINDINGS: Intrauterine gestational sac: Single Yolk sac:  Visualized. Embryo:  Visualized. Cardiac Activity: Visualized. Heart Rate: 110 bpm CRL: 2.8  mm   5 w   6 d                  [place] EDC: 01/18/2021 Subchorionic hemorrhage:  None visualized. Maternal uterus/adnexae: Normal bilateral ovaries. Trace amount of free fluid around the left ovary. No adnexal mass. IMPRESSION: Single live  intrauterine pregnancy as detailed above. Electronically Signed   By: Kathreen Devoid   On: 05/24/2020 10:59    ____________________________________________   PROCEDURES  Procedure(s) performed (including Critical Care):  Procedures   ____________________________________________   INITIAL IMPRESSION / ASSESSMENT AND PLAN / ED COURSE  As part of my medical decision making, I reviewed the following data within the electronic MEDICAL RECORD NUMBER Notes from prior ED visits and [place] Controlled Substance Database  Kaylyn Garrow was evaluated in Emergency Department on 05/24/2020 for the symptoms described in the history of present illness. She was evaluated in the context of the global COVID-19 pandemic, which necessitated consideration that the patient might be at risk for infection with the SARS-CoV-2 virus that causes COVID-19. Institutional protocols and algorithms that pertain to the evaluation of patients at risk for COVID-19 are in a state of rapid change based on information released by regulatory bodies including the CDC and federal and state  organizations. These policies and algorithms were followed during the patient's care in the ED.  19 year old female presents to the ED with a history of nausea and vomiting for the last 3 days.  She was seen at [facility] urgent care yesterday and was told that she was pregnant.  Beta-hCG, urinalysis, wet prep and STD panel was reviewed.  Patient beta-hCG was 44,599.  Patient did have some budding yeast without symptoms.  Patient states that she was told that she had a urinary tract infection but there was no antibiotic at her pharmacy.  We discussed findings on her ultrasound done today and patient is aware that she is 5 weeks and 6 days with a single IUP.  A prescription for Macrobid for 7 days and Doxylamine-Pyridoxine 10-32m was sent to her pharmacy.  Patient will remain on clear liquids until her nausea subsides and then gradually increase her diet.  She is  return to the emergency department over the weekend if any severe worsening of her symptoms. ____________________________________________   FINAL CLINICAL IMPRESSION(S) / ED DIAGNOSES  Final diagnoses:  First trimester pregnancy  Acute UTI     ED Discharge Orders         Ordered    Doxylamine-Pyridoxine 10-10 MG TBEC     Discontinue  Reprint     05/24/20 1142    nitrofurantoin, macrocrystal-monohydrate, (MACROBID) 100 MG capsule  2 times daily     Discontinue  Reprint     05/24/20 1142           Note:  This document was prepared using Dragon voice recognition software and may include unintentional dictation errors.    SJohnn Hai PA-C 05/24/20 1506    KLavonia Drafts MD 05/24/20 [phone]

## 2020-05-24 NOTE — ED Triage Notes (Signed)
Pt sent by UC in [place] for follow up US.

## 2020-05-27 ENCOUNTER — Other Ambulatory Visit (INDEPENDENT_AMBULATORY_CARE_PROVIDER_SITE_OTHER): Payer: Self-pay | Admitting: Emergency Medicine

## 2020-05-31 ENCOUNTER — Other Ambulatory Visit: Payer: Self-pay

## 2020-05-31 DIAGNOSIS — Z3A01 Less than 8 weeks gestation of pregnancy: Secondary | ICD-10-CM | POA: Insufficient documentation

## 2020-05-31 DIAGNOSIS — O26811 Pregnancy related exhaustion and fatigue, first trimester: Secondary | ICD-10-CM | POA: Diagnosis not present

## 2020-05-31 DIAGNOSIS — R1110 Vomiting, unspecified: Secondary | ICD-10-CM | POA: Diagnosis present

## 2020-05-31 DIAGNOSIS — O219 Vomiting of pregnancy, unspecified: Secondary | ICD-10-CM | POA: Insufficient documentation

## 2020-05-31 LAB — CBC
HCT: 38.6 % (ref 36.0–46.0)
Hemoglobin: 12.8 g/dL (ref 12.0–15.0)
MCH: 29.8 pg (ref 26.0–34.0)
MCHC: 33.2 g/dL (ref 30.0–36.0)
MCV: 90 fL (ref 80.0–100.0)
Platelets: 360 10*3/uL (ref 150–400)
RBC: 4.29 MIL/uL (ref 3.87–5.11)
RDW: 12.2 % (ref 11.5–15.5)
WBC: 8 10*3/uL (ref 4.0–10.5)
nRBC: 0 % (ref 0.0–0.2)

## 2020-05-31 LAB — BASIC METABOLIC PANEL
Anion gap: 13 (ref 5–15)
BUN: 10 mg/dL (ref 6–20)
CO2: 25 mmol/L (ref 22–32)
Calcium: 10.1 mg/dL (ref 8.9–10.3)
Chloride: 98 mmol/L (ref 98–111)
Creatinine, Ser: 0.52 mg/dL (ref 0.44–1.00)
GFR calc Af Amer: >60 mL/min (ref >60)
GFR calc non Af Amer: >60 mL/min (ref >60)
Glucose, Bld: 91 mg/dL (ref 70–99)
Potassium: 3.3 mmol/L — ABNORMAL LOW (ref 3.5–5.1)
Sodium: 136 mmol/L (ref 135–145)

## 2020-05-31 NOTE — ED Triage Notes (Signed)
Pt states that she has been vomiting a lot for the past 4 days, states that today when she was getting out the shower she passed out, pt is almost [redacted] weeks pregnant

## 2020-05-31 NOTE — ED Notes (Signed)
First Nurse Note: Pt to ED via EMS for nausea. Pt is [redacted] weeks pregnant.

## 2020-06-01 ENCOUNTER — Emergency Department
Admission: EM | Admit: 2020-06-01 | Discharge: 2020-06-01 | Disposition: A | Discharge status: Home / Self Care | Payer: Medicaid Other | Attending: Emergency Medicine | Admitting: Emergency Medicine

## 2020-06-01 ENCOUNTER — Other Ambulatory Visit: Payer: Medicaid Other

## 2020-06-01 ENCOUNTER — Emergency Department: Payer: Medicaid Other

## 2020-06-01 DIAGNOSIS — O210 Mild hyperemesis gravidarum: Secondary | ICD-10-CM

## 2020-06-01 DIAGNOSIS — O219 Vomiting of pregnancy, unspecified: Secondary | ICD-10-CM

## 2020-06-01 LAB — URINALYSIS, COMPLETE (UACMP) WITH MICROSCOPIC
Bilirubin Urine: NEGATIVE
Glucose, UA: NEGATIVE mg/dL
Ketones, ur: 80 mg/dL — AB
Nitrite: NEGATIVE
Protein, ur: >=300 mg/dL — AB
Specific Gravity, Urine: 1.03 (ref 1.005–1.030)
pH: 5 (ref 5.0–8.0)

## 2020-06-01 LAB — HCG, QUANTITATIVE, PREGNANCY: hCG, Beta Chain, Quant, S: 134643 m[IU]/mL — ABNORMAL HIGH (ref <5)

## 2020-06-01 IMAGING — US US OB < 14 WEEKS - US OB TV
1 series · 15 of 28 positions shown · non-contrast
Comparison: Prior ultrasound from [DATE].

CLINICAL DATA: Initial evaluation for acute nausea, vomiting,
syncope. Early pregnancy.

EXAM:
OBSTETRIC <14 WK US AND TRANSVAGINAL OB US
TECHNIQUE: Both transabdominal and transvaginal ultrasound examinations were
performed for complete evaluation of the gestation as well as the
maternal uterus, adnexal regions, and pelvic cul-de-sac.
Transvaginal technique was performed to assess early pregnancy.

[Series 1: us ob < 14 weeks - us ob tv · 15 of 79 slices shown]
[im 1/79]
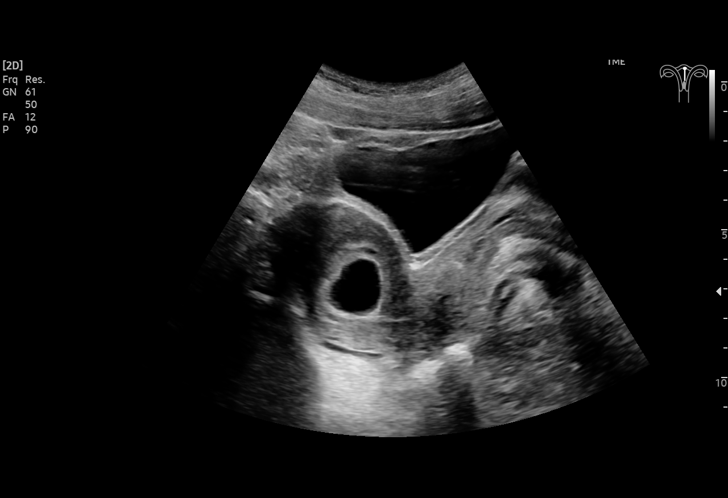
[im 6/79]
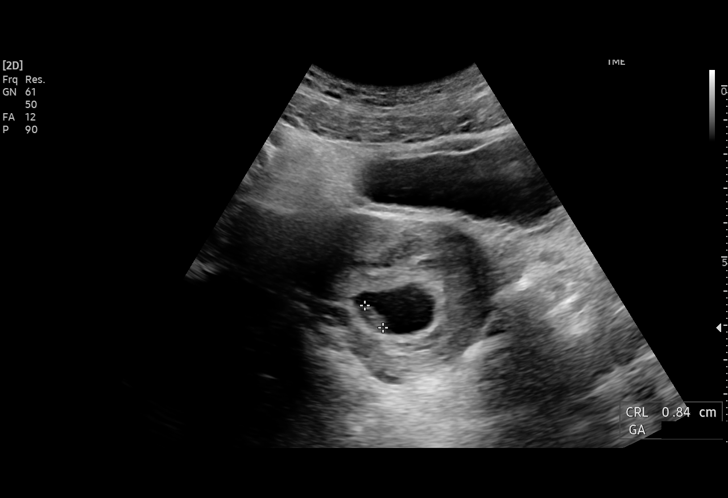
[im 12/79]
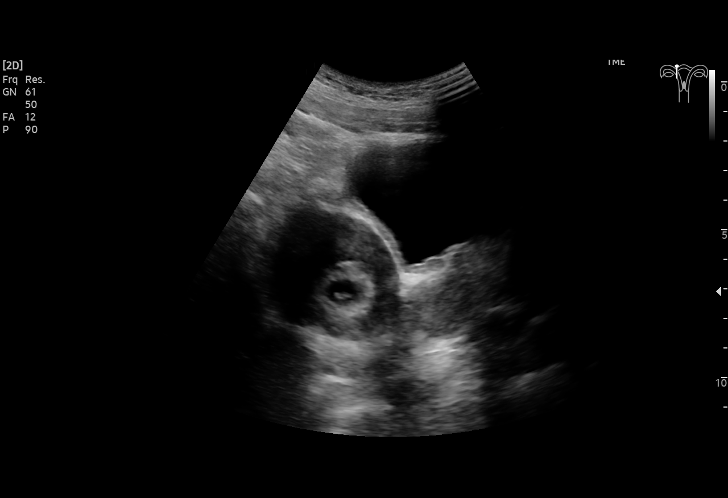
[im 18/79]
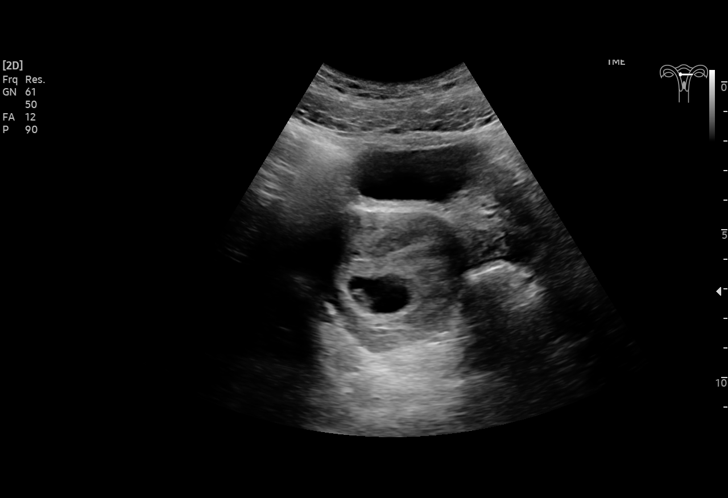
[im 24/79]
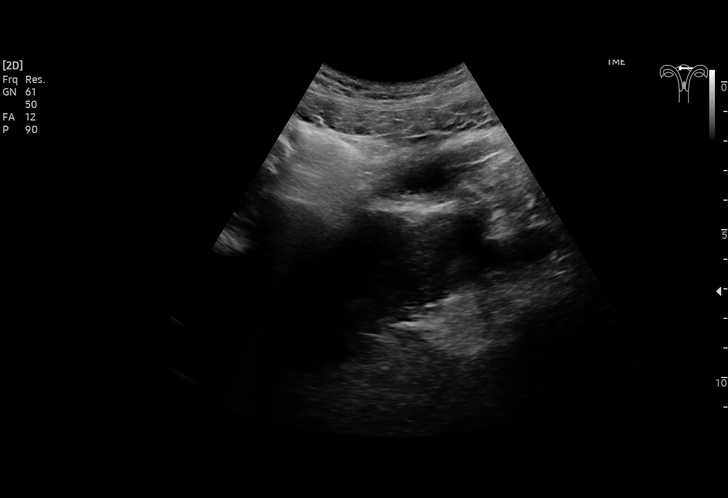
[im 29/79]
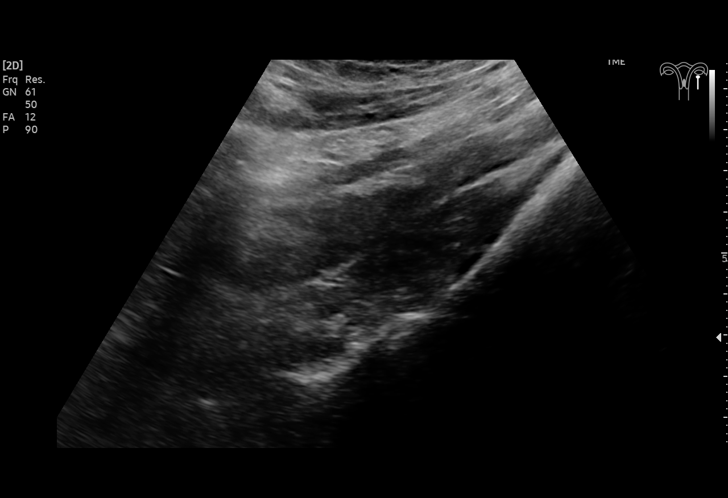
[im 35/79]
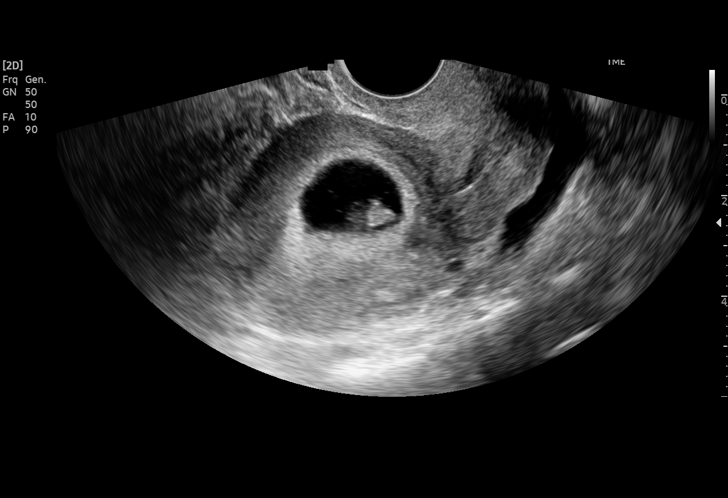
[im 41/79]
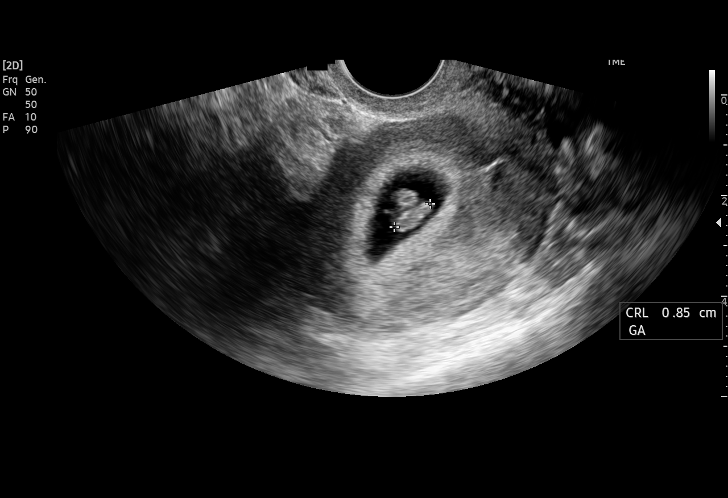
[im 44/79]
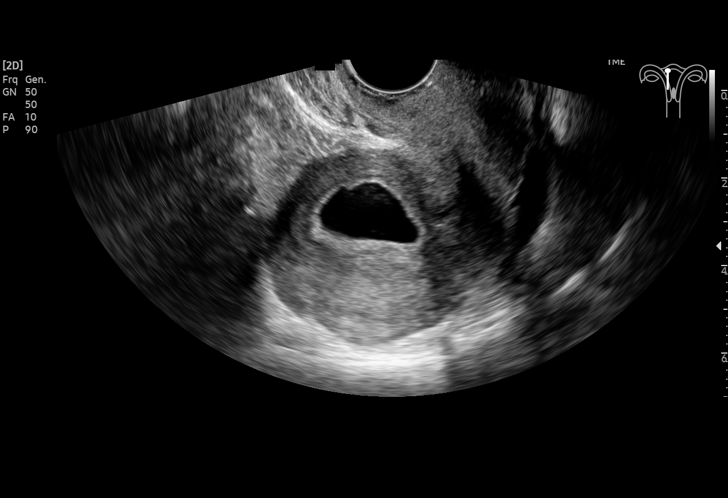
[im 50/79]
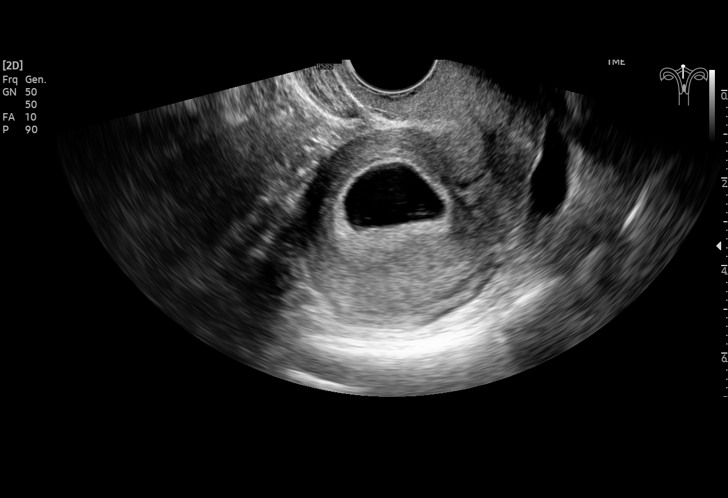
[im 55/79]
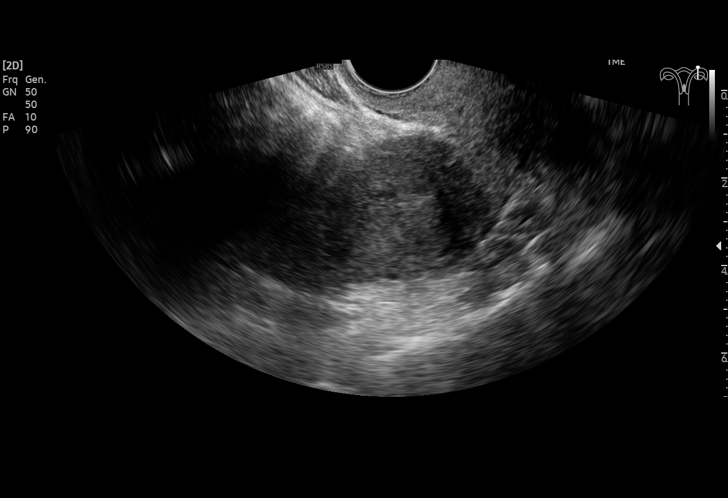
[im 61/79]
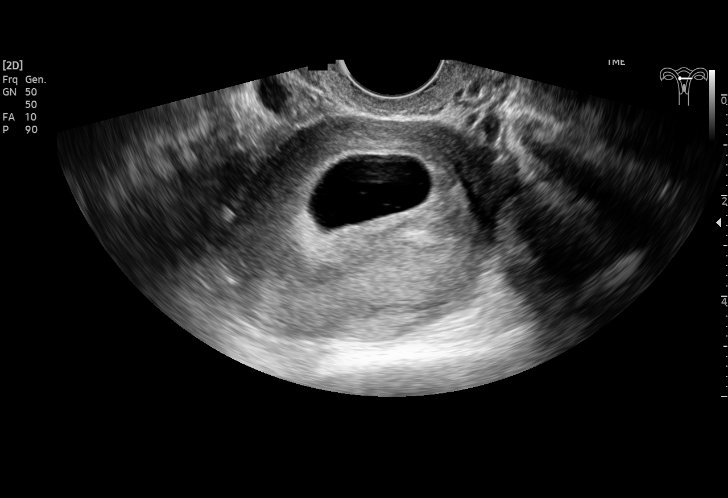
[im 67/79]
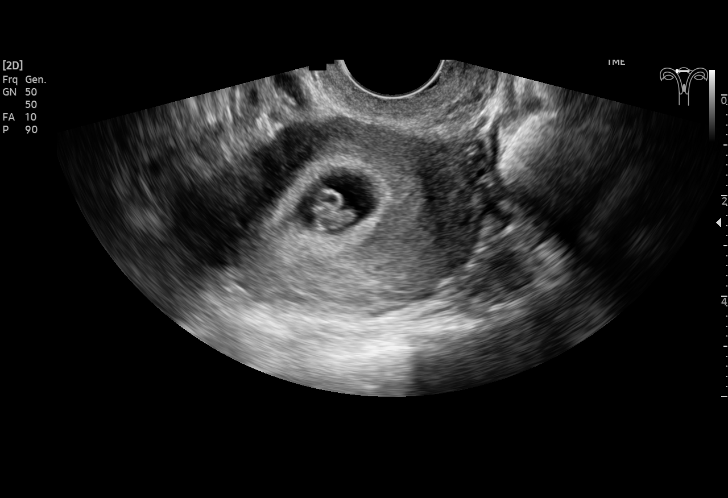
[im 73/79]
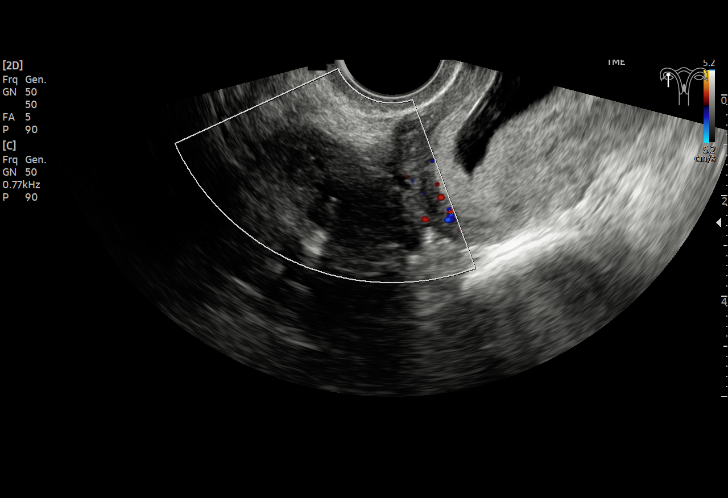
[im 79/79]
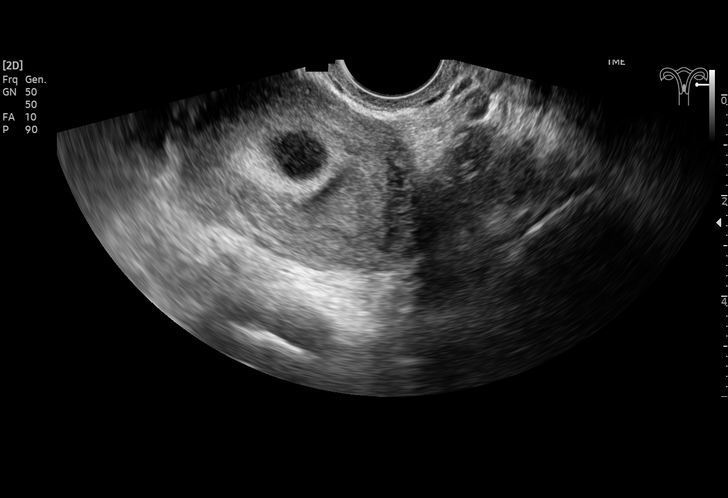

[15 of 28 positions shown; findings below may reference images not displayed]

FINDINGS: Intrauterine gestational sac: Signal

Yolk sac:  Present

Embryo:  Present

Cardiac Activity: Present

Heart Rate: 142 bpm

CRL: 8.5 mm   6 w   5 d                  US EDC: [DATE]

Subchorionic hemorrhage:  None visualized.

Maternal uterus/adnexae: Right ovary normal in appearance. Left
ovary not visualized. No adnexal mass. Trace free fluid noted within
the pelvis.
IMPRESSION: 1. Single viable intrauterine pregnancy as above, estimated
gestational age 6 weeks and 5 days by crown-rump length, with
ultrasound EDC of [DATE]. No complication.
2. No other acute maternal uterine or adnexal abnormality
identified.

## 2020-06-01 MED ORDER — SODIUM CHLORIDE 0.9 % IV BOLUS
1000.0000 mL | Freq: Once | INTRAVENOUS | Status: AC
  Administered 2020-06-01: 1000 mL via INTRAVENOUS

## 2020-06-01 MED ORDER — METOCLOPRAMIDE HCL 10 MG PO TABS
10.0000 mg | ORAL_TABLET | Freq: Four times a day (QID) | ORAL | 0 refills | Status: DC | PRN
Start: 2020-06-01 — End: 2020-06-23

## 2020-06-01 MED ORDER — METOCLOPRAMIDE HCL 5 MG/ML IJ SOLN
10.0000 mg | Freq: Once | INTRAMUSCULAR | Status: AC
  Administered 2020-06-01: 10 mg via INTRAVENOUS
  Filled 2020-06-01: qty 2

## 2020-06-01 NOTE — ED Notes (Signed)
Pt reviewed DC information with this RN and stated understanding. Pt gave verbal consent for DC d/t sign pad not working.

## 2020-06-01 NOTE — ED Notes (Signed)
Pt informed this RN of dx of UTI and prescribed abx on 8/5. Pt stated she has not started0 taking abx yet d/t N/V.  EDP aware, pt instructed to provide urine sample as soon as possible.  Warm blankets and ice pack given per pt request. No other needs at this time. NAD noted, call bell within reach.

## 2020-06-01 NOTE — Discharge Instructions (Addendum)
Please call your OB/GYN Monday morning to arrange a follow-up appointment.  Please take your Reglan as needed, as prescribed for nausea.  Attempt to stay very hydrated if possible.  Return to the emergency department if you are unable to stay hydrated due to vomiting, or any other symptom personally concerning to yourself.

## 2020-06-01 NOTE — ED Provider Notes (Signed)
[facility] Emergency Department Provider Note  Time seen: 2:48 AM  I have reviewed the triage vital signs and the nursing notes.   HISTORY  Chief Complaint Emesis, Dizziness, and Emesis During Pregnancy   HPI Brianna Lloyd is a 19 y.o. female with a past medical history of PTSD presents to the emergency department approximately [redacted] weeks pregnant with nausea vomiting.  According to the patient she is approximately [redacted] weeks pregnant, states for the past 1 week or so she has been extremely nauseated with frequent episodes of vomiting.  Over the past 3 days she has largely stayed in bed due to the nausea.  Patient states she tried to get up today and take a shower.  After getting out of the shower she felt lightheaded and briefly passed out or nearly passed out per patient.  Denies any chest pain shortness of breath.  No abdominal pain vaginal bleeding or fluid leakage.  This is the patient's first pregnancy.  No past medical history on file.  Patient Active Problem List   Diagnosis Date Noted  . Aggressive behavior of adolescent   . PTSD (post-traumatic stress disorder) 01/20/2016    Past Surgical History:  Procedure Laterality Date  . NO PAST SURGERIES      Prior to Admission medications   Medication Sig Start Date End Date Taking? Authorizing Provider  cetirizine (ZYRTEC) 10 MG tablet Take 10 mg by mouth daily.    [provider]  Cholecalciferol (VITAMIN D3) 2000 units capsule Take 2,000 Units by mouth daily.    [provider]  Doxylamine-Pyridoxine 10-10 MG TBEC 1 tablet at bedtime for 2 nights and then increase to 2 tablets at bedtime for nausea 05/24/20   Johnn Hai, PA-C  Halobetasol-Ammonium Lactate 0.05 & 12 % (Cream) KIT Apply 1 application topically 2 (two) times daily.    [provider]  tretinoin (RETIN-A) 0.025 % cream Apply 1 application topically at bedtime.    [provider]  triamcinolone cream  (KENALOG) 0.1 % Apply 1 application topically 2 (two) times daily.    [provider]  amphetamine-dextroamphetamine (ADDERALL XR) 15 MG 24 hr capsule Take 1 capsule by mouth daily with breakfast. 02/03/16 05/23/20  Orlie Dakin, MD  FLUoxetine (PROZAC) 20 MG capsule Take 1 capsule (20 mg total) by mouth daily. 02/03/16 05/24/20  Orlie Dakin, MD  risperiDONE (RISPERDAL) 0.5 MG tablet Take 1 tablet (0.5 mg total) by mouth daily. 02/03/16 05/24/20  Orlie Dakin, MD    No Known Allergies  Family History  Problem Relation Age of Onset  . Schizophrenia Mother   . Alcohol abuse Mother   . Schizophrenia Father   . Alcohol abuse Father     Social History Social History   Tobacco Use  . Smoking status: Never Smoker  . Smokeless tobacco: Never Used  Vaping Use  . Vaping Use: Every day  Substance Use Topics  . Alcohol use: Yes  . Drug use: Yes    Types: Marijuana    Review of Systems Constitutional: Negative for fever. Cardiovascular: Negative for chest pain. Respiratory: Negative for shortness of breath. Gastrointestinal: Negative for abdominal pain.  Positive for nausea vomiting. Genitourinary: Negative for urinary compaints.  Negative for vaginal bleeding. Musculoskeletal: Negative for musculoskeletal complaints Neurological: Negative for headache All other ROS negative  ____________________________________________   PHYSICAL EXAM:  VITAL SIGNS: ED Triage Vitals [05/31/20 1702]  Enc Vitals Group     BP (!) 109/53     Pulse Rate  77     Resp 16     Temp 98.5 F (36.9 C)     Temp Source Oral     SpO2 100 %     Weight 110 lb (49.9 kg)     Height 5' (1.524 m)     Head Circumference      Peak Flow      Pain Score 8     Pain Loc      Pain Edu?      Excl. in [place]?     Constitutional: Alert and oriented. Well appearing and in no distress. Eyes: Normal exam ENT      Head: Normocephalic and atraumatic.      Mouth/Throat: Mucous membranes are  moist. Cardiovascular: Normal rate, regular rhythm.  Respiratory: Normal respiratory effort without tachypnea nor retractions. Breath sounds are clear  Gastrointestinal: Soft and nontender. No distention.  Musculoskeletal: Nontender with normal range of motion in all extremities. Neurologic:  Normal speech and language. No gross focal neurologic deficits Skin:  Skin is warm, dry and intact.  Psychiatric: Mood and affect are normal.  ____________________________________________    EKG  EKG viewed and interpreted by myself shows a normal sinus rhythm at 74 bpm with a narrow QRS, normal axis, normal intervals, no concerning ST changes.  ____________________________________________    RADIOLOGY  Ultrasound shows a live IUP 6 weeks 5 days with a heart rate of 142.  ____________________________________________   INITIAL IMPRESSION / ASSESSMENT AND PLAN / ED COURSE  Pertinent labs & imaging results that were available during my care of the patient were reviewed by me and considered in my medical decision making (see chart for details).   Patient presents to the emergency department for nausea vomiting approximately [redacted] weeks pregnant.  Ultrasound confirms IUP 6 weeks 5 days with a heart rate of 142.  Patient continues to feel nauseated we will place an IV, dose IV fluids and IV Reglan.  Patient called her OB earlier this week and was prescribed likely just.  Patient has been taking but without any relief of her nausea.  No abdominal pain, benign abdominal exam.  Patient states she is feeling much better after medication.  Patient was received 2 L of normal saline.  Patient is able to orally hydrate at this time.  We will discharge with a prescription for Reglan.  Patient agreeable.  [name] was evaluated in Emergency Department on 06/01/2020 for the symptoms described in the history of present illness. She was evaluated in the context of the global COVID-19 pandemic, which  necessitated consideration that the patient might be at risk for infection with the SARS-CoV-2 virus that causes COVID-19. Institutional protocols and algorithms that pertain to the evaluation of patients at risk for COVID-19 are in a state of rapid change based on information released by regulatory bodies including the CDC and federal and state organizations. These policies and algorithms were followed during the patient's care in the ED.  ____________________________________________   FINAL CLINICAL IMPRESSION(S) / ED DIAGNOSES  Hyperemesis gravidarum   Harvest Dark, MD 06/01/20 [phone]

## 2020-06-09 DIAGNOSIS — O0973 Supervision of high risk pregnancy due to social problems, third trimester: Secondary | ICD-10-CM | POA: Insufficient documentation

## 2020-06-09 DIAGNOSIS — Z3401 Encounter for supervision of normal first pregnancy, first trimester: Secondary | ICD-10-CM | POA: Insufficient documentation

## 2020-06-21 ENCOUNTER — Observation Stay: Payer: Medicaid Other

## 2020-06-21 ENCOUNTER — Emergency Department
Admission: EM | Admit: 2020-06-21 | Discharge: 2020-06-21 | Discharge status: Absent without leave | Payer: Medicaid Other

## 2020-06-21 ENCOUNTER — Other Ambulatory Visit: Payer: Self-pay

## 2020-06-21 ENCOUNTER — Inpatient Hospital Stay
Admission: AD | Admit: 2020-06-21 | Discharge: 2020-06-23 | DRG: 831 | Disposition: A | Discharge status: Home / Self Care | Payer: Medicaid Other | Attending: Obstetrics and Gynecology | Admitting: Obstetrics and Gynecology

## 2020-06-21 ENCOUNTER — Encounter: Payer: Self-pay | Admitting: Obstetrics and Gynecology

## 2020-06-21 DIAGNOSIS — Z20822 Contact with and (suspected) exposure to covid-19: Secondary | ICD-10-CM | POA: Diagnosis present

## 2020-06-21 DIAGNOSIS — O210 Mild hyperemesis gravidarum: Secondary | ICD-10-CM | POA: Diagnosis present

## 2020-06-21 DIAGNOSIS — F4310 Post-traumatic stress disorder, unspecified: Secondary | ICD-10-CM | POA: Diagnosis present

## 2020-06-21 DIAGNOSIS — F411 Generalized anxiety disorder: Secondary | ICD-10-CM | POA: Diagnosis present

## 2020-06-21 DIAGNOSIS — O99611 Diseases of the digestive system complicating pregnancy, first trimester: Secondary | ICD-10-CM | POA: Diagnosis present

## 2020-06-21 DIAGNOSIS — O211 Hyperemesis gravidarum with metabolic disturbance: Principal | ICD-10-CM | POA: Diagnosis present

## 2020-06-21 DIAGNOSIS — R109 Unspecified abdominal pain: Secondary | ICD-10-CM

## 2020-06-21 DIAGNOSIS — Z3A10 10 weeks gestation of pregnancy: Secondary | ICD-10-CM

## 2020-06-21 DIAGNOSIS — R45851 Suicidal ideations: Secondary | ICD-10-CM | POA: Diagnosis present

## 2020-06-21 DIAGNOSIS — F4320 Adjustment disorder, unspecified: Secondary | ICD-10-CM | POA: Diagnosis present

## 2020-06-21 DIAGNOSIS — K851 Biliary acute pancreatitis without necrosis or infection: Secondary | ICD-10-CM | POA: Diagnosis present

## 2020-06-21 DIAGNOSIS — O99341 Other mental disorders complicating pregnancy, first trimester: Secondary | ICD-10-CM | POA: Diagnosis present

## 2020-06-21 DIAGNOSIS — F259 Schizoaffective disorder, unspecified: Secondary | ICD-10-CM | POA: Diagnosis present

## 2020-06-21 HISTORY — DX: Other symptoms and signs involving appearance and behavior: R46.89

## 2020-06-21 LAB — SARS CORONAVIRUS 2 BY RT PCR (HOSPITAL ORDER, PERFORMED IN ~~LOC~~ HOSPITAL LAB): SARS Coronavirus 2: NEGATIVE

## 2020-06-21 LAB — CBC
HCT: 41.5 % (ref 36.0–46.0)
Hemoglobin: 14.7 g/dL (ref 12.0–15.0)
MCH: 29.9 pg (ref 26.0–34.0)
MCHC: 35.4 g/dL (ref 30.0–36.0)
MCV: 84.3 fL (ref 80.0–100.0)
Platelets: 336 10*3/uL (ref 150–400)
RBC: 4.92 MIL/uL (ref 3.87–5.11)
RDW: 11.9 % (ref 11.5–15.5)
WBC: 5.2 10*3/uL (ref 4.0–10.5)
nRBC: 0 % (ref 0.0–0.2)

## 2020-06-21 LAB — COMPREHENSIVE METABOLIC PANEL
ALT: 297 U/L — ABNORMAL HIGH (ref 0–44)
AST: 130 U/L — ABNORMAL HIGH (ref 15–41)
Albumin: 4.6 g/dL (ref 3.5–5.0)
Alkaline Phosphatase: 58 U/L (ref 38–126)
Anion gap: 18 — ABNORMAL HIGH (ref 5–15)
BUN: 10 mg/dL (ref 6–20)
CO2: 22 mmol/L (ref 22–32)
Calcium: 10 mg/dL (ref 8.9–10.3)
Chloride: 93 mmol/L — ABNORMAL LOW (ref 98–111)
Creatinine, Ser: 0.52 mg/dL (ref 0.44–1.00)
GFR calc Af Amer: >60 mL/min (ref >60)
GFR calc non Af Amer: >60 mL/min (ref >60)
Glucose, Bld: 111 mg/dL — ABNORMAL HIGH (ref 70–99)
Potassium: 2.7 mmol/L — CL (ref 3.5–5.1)
Sodium: 133 mmol/L — ABNORMAL LOW (ref 135–145)
Total Bilirubin: 1.9 mg/dL — ABNORMAL HIGH (ref 0.3–1.2)
Total Protein: 8.6 g/dL — ABNORMAL HIGH (ref 6.5–8.1)

## 2020-06-21 LAB — LIPASE, BLOOD: Lipase: 585 U/L — ABNORMAL HIGH (ref 11–51)

## 2020-06-21 LAB — MAGNESIUM: Magnesium: 1.9 mg/dL (ref 1.7–2.4)

## 2020-06-21 LAB — AMYLASE: Amylase: 599 U/L — ABNORMAL HIGH (ref 28–100)

## 2020-06-21 IMAGING — US US ABDOMEN COMPLETE
1 series · 15 of 25 positions shown · non-contrast
Comparison: None.

CLINICAL DATA: Abdominal pain, elevated serum lipase, pregnant

EXAM:
ABDOMEN ULTRASOUND COMPLETE

[Series 1: us abdomen complete · 15 of 97 slices shown]
[im 1/97]
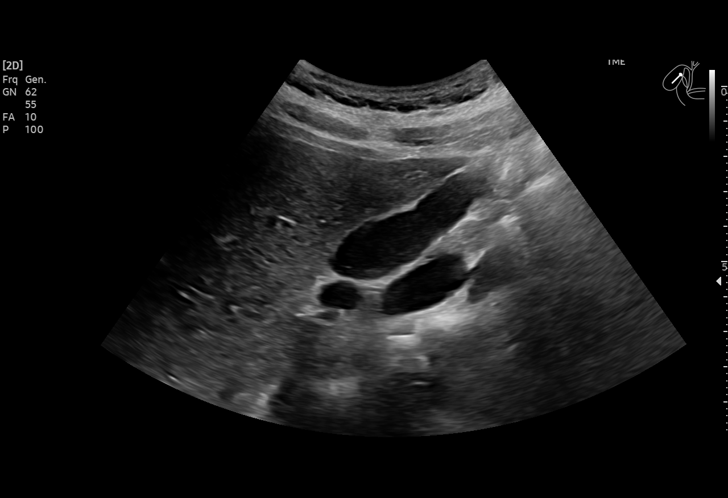
[im 9/97]
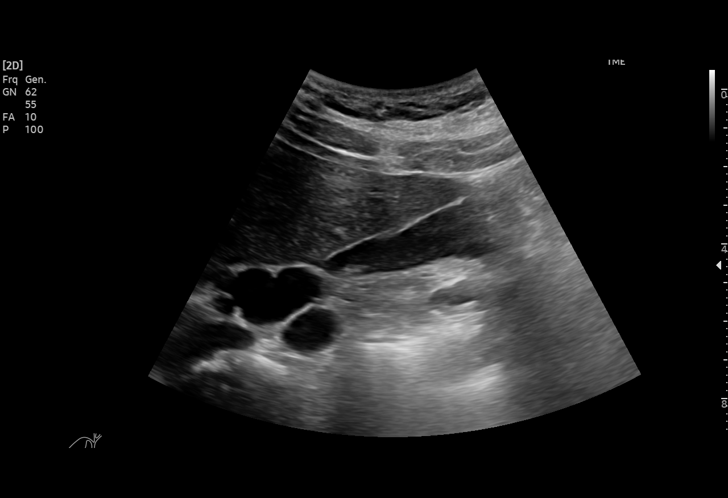
[im 17/97]
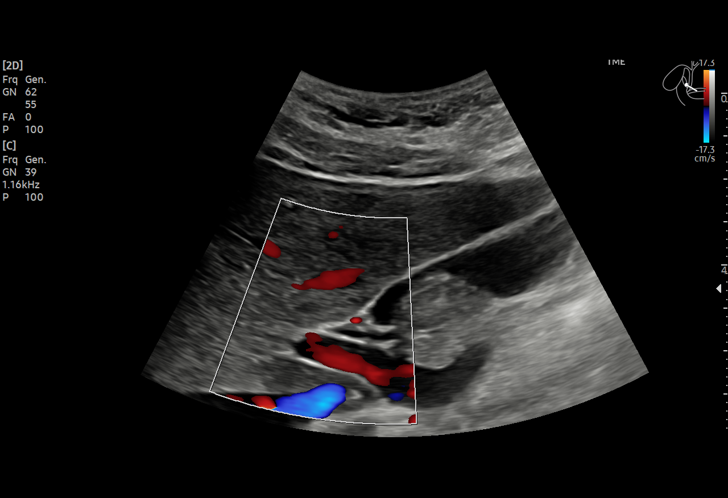
[im 21/97]
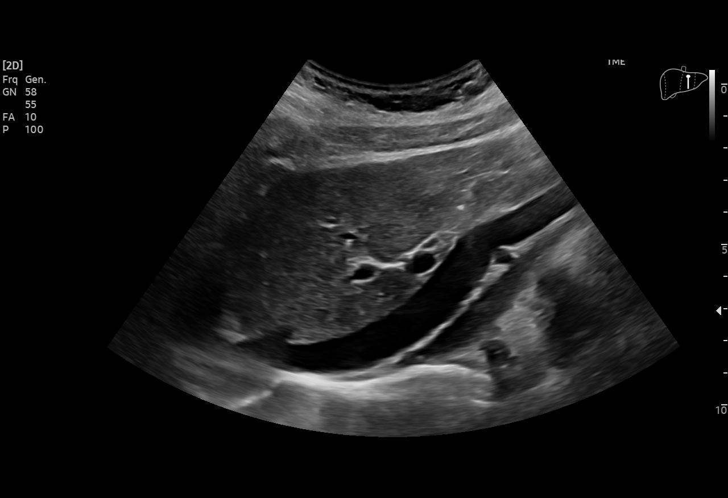
[im 29/97]
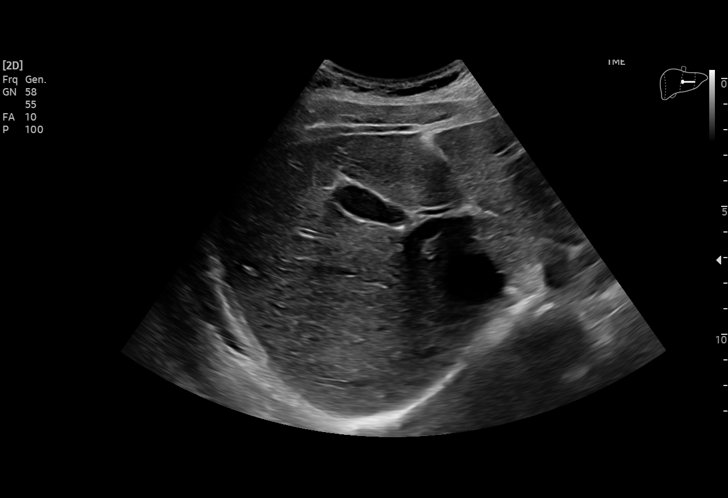
[im 37/97]
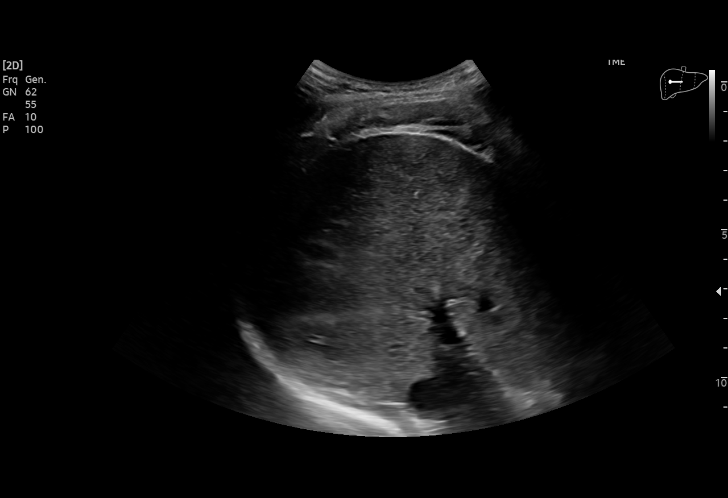
[im 41/97]
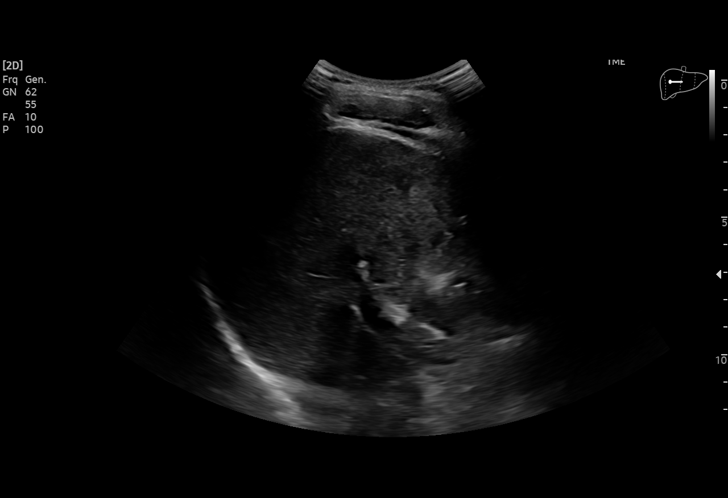
[im 49/97]
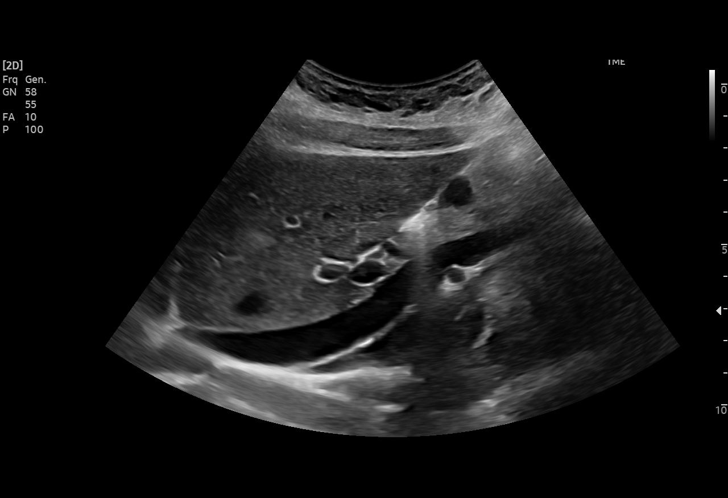
[im 57/97]
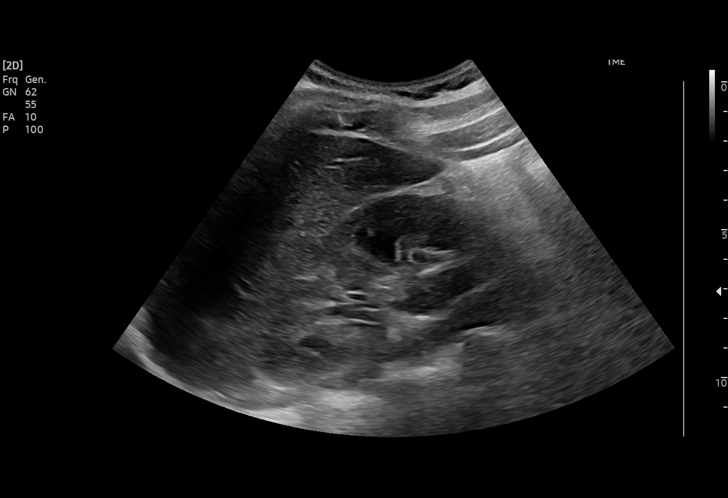
[im 61/97]
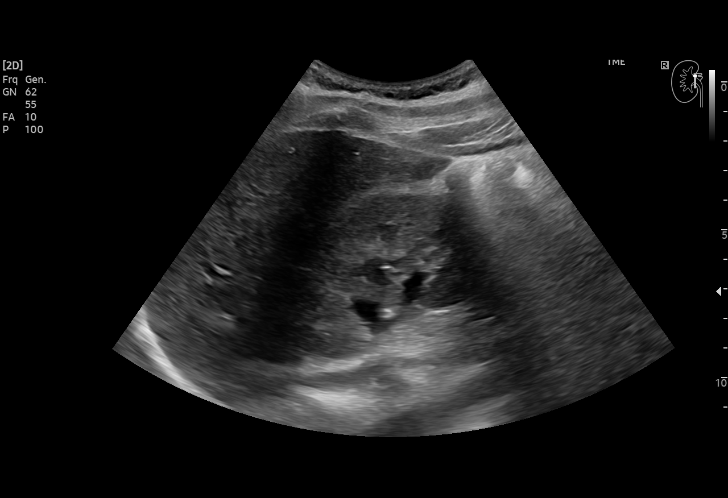
[im 69/97]
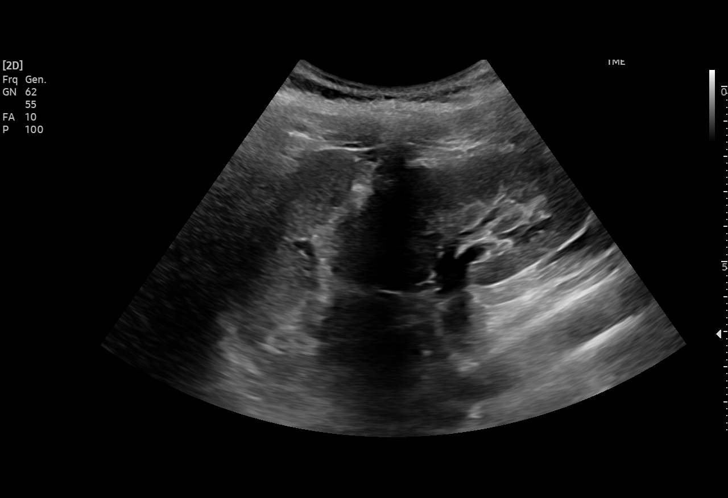
[im 77/97]
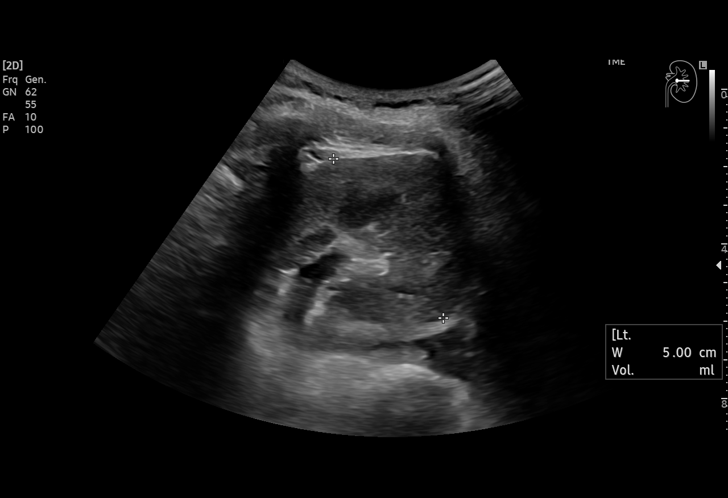
[im 81/97]
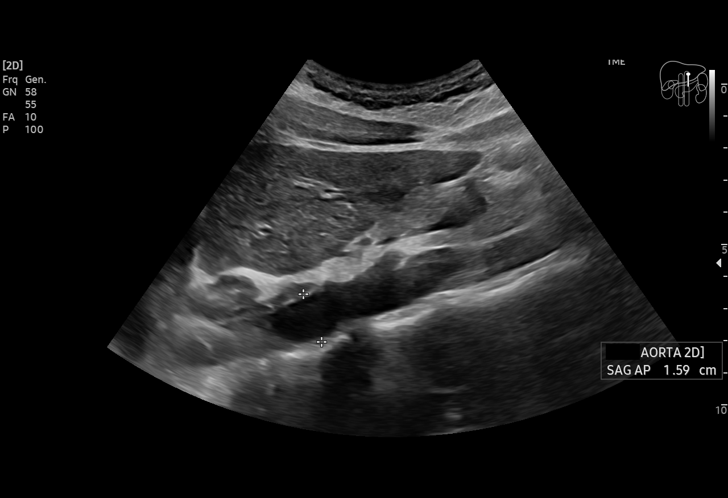
[im 89/97]
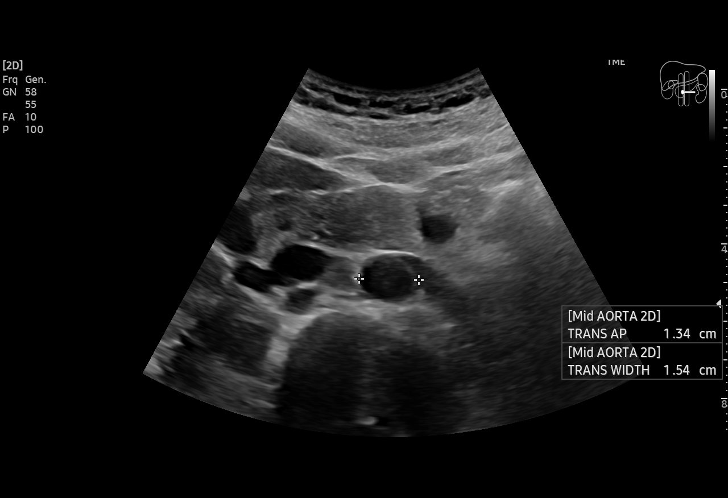
[im 97/97]
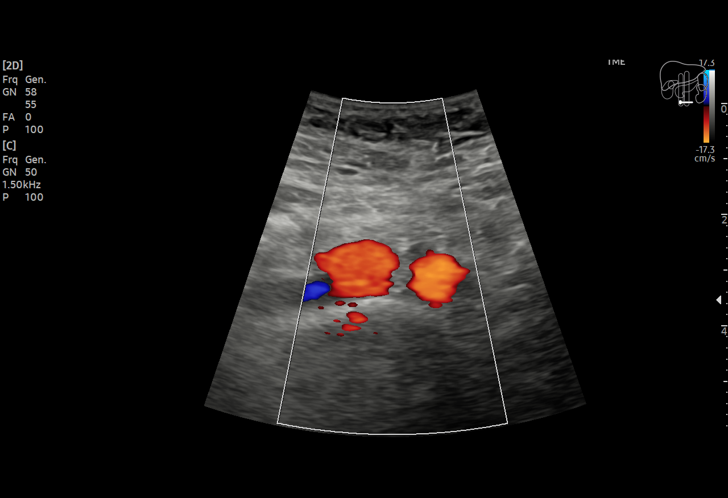

[15 of 25 positions shown; findings below may reference images not displayed]

FINDINGS: Gallbladder: The gallbladder contains sludge, however, the
gallbladder is not distended, there is no gallbladder wall
thickening, and no pericholecystic fluid is identified. The
sonographic Murphy sign is reportedly negative.

Common bile duct: Diameter: 3 mm in proximal diameter

Liver: No focal lesion identified. Within normal limits in
parenchymal echogenicity. Portal vein is patent on color Doppler
imaging with normal direction of blood flow towards the liver.

IVC: No abnormality visualized.

Pancreas: Visualized portion unremarkable.

Spleen: Size and appearance within normal limits.

Right Kidney: Length: 9.4 cm. Echogenicity within normal limits. No
mass or hydronephrosis visualized.

Left Kidney: Length: 9.7 cm. Echogenicity within normal limits. No
mass or hydronephrosis visualized.

Abdominal aorta: No aneurysm visualized.

Other findings: None.
IMPRESSION: 1. Gallbladder sludge without evidence of acute cholecystitis.
2. Otherwise unremarkable abdominal ultrasound.

## 2020-06-21 IMAGING — MR MR MRCP
11 of 12 series · 38 of 48 positions shown · non-contrast
Comparison: None.

CLINICAL DATA: Pregnant, hyperemesis gravidarum, pancreatitis

EXAM:
MRI ABDOMEN WITHOUT CONTRAST  (INCLUDING MRCP)
TECHNIQUE: Multiplanar multisequence MR imaging of the abdomen was performed.
Heavily T2-weighted images of the biliary and pancreatic ducts were
obtained, and three-dimensional MRCP images were rendered by post
processing.

[Series 3: T2 · coronal · 6.0mm · 1.09mm/px · 3 of 24 slices shown (1 of 2)]
[im 1/24]
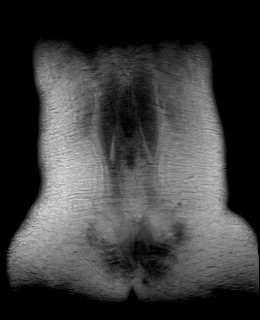
[im 12/24]
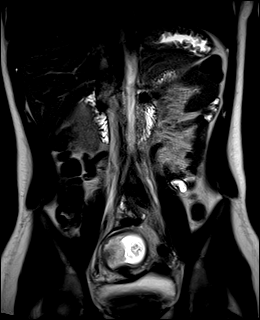
[im 24/24]
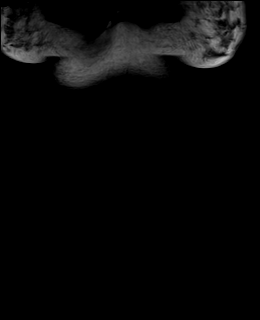

[Series 4: T2 · axial · 6.0mm · 1.09mm/px · z∈[-32,+176]mm · 3 of 30 slices shown (2 of 2)]
[im 1/30]
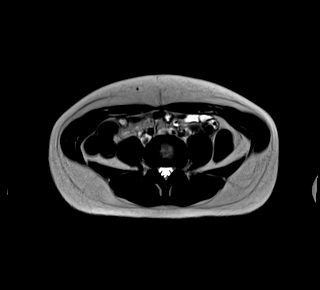
[im 15/30]
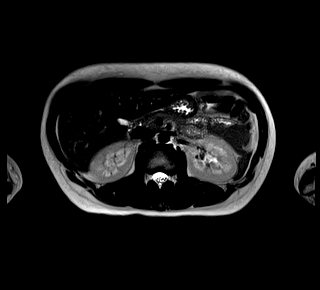
[im 30/30]
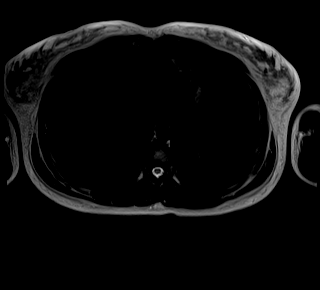

[Series 5: T1 · axial · 6.0mm · 0.68mm/px · z∈[-32,+176]mm · 3 of 30 slices shown (1 of 2)]
[im 1/30]
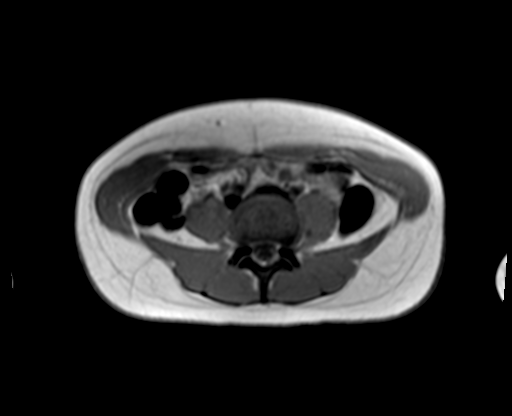
[im 15/30]
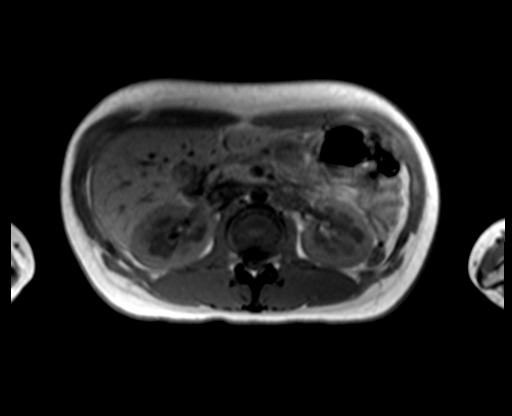
[im 30/30]
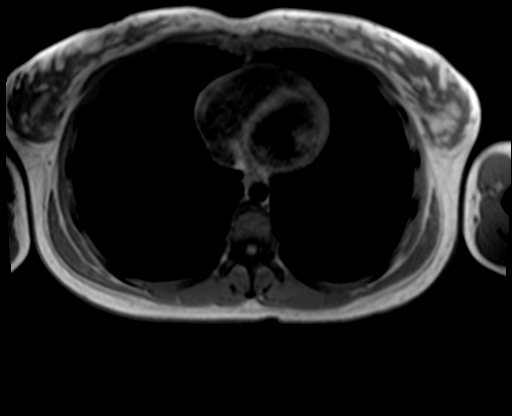

[Series 5: T1 · axial · 6.0mm · 0.68mm/px · z∈[-32,+176]mm · 3 of 30 slices shown (2 of 2)]
[im 1/30]
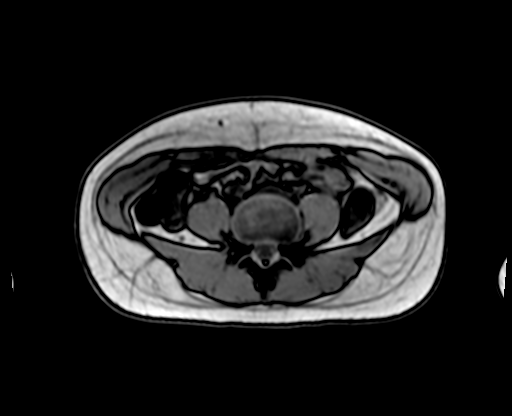
[im 15/30]
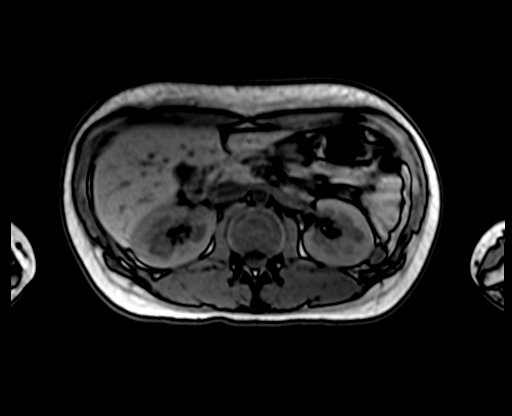
[im 30/30]
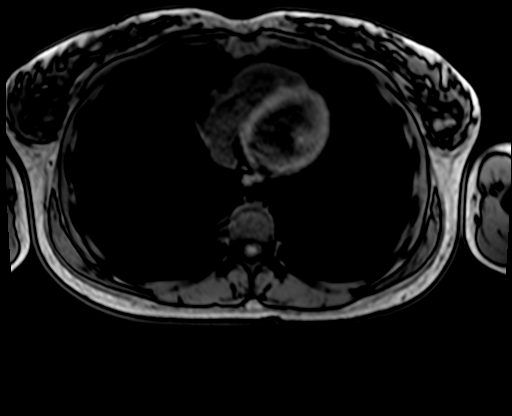

[Series 8: T2 fat-sat · axial · 6.0mm · 1.09mm/px · z∈[-32,+176]mm · 3 of 30 slices shown]
[im 1/30]
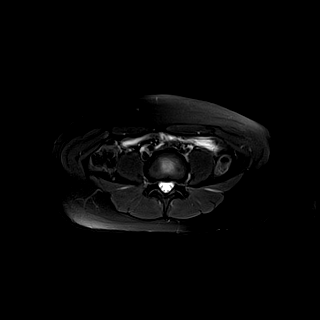
[im 15/30]
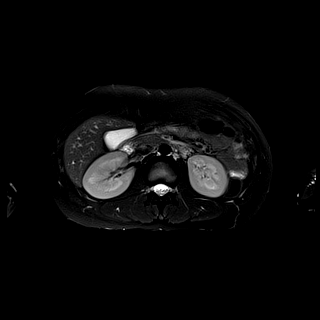
[im 30/30]
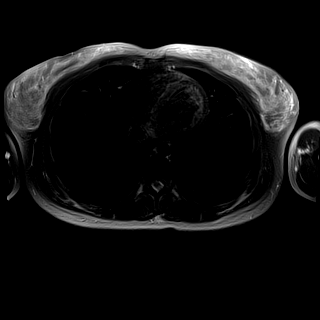

[Series 9: ax dwi_tracew · axial · 6.0mm · 1.31mm/px · z∈[-32,+176]mm · 8 of 90 slices shown]
[im 1/90]
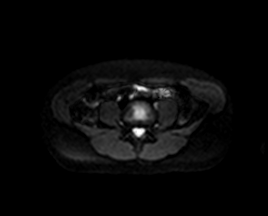
[im 10/90]
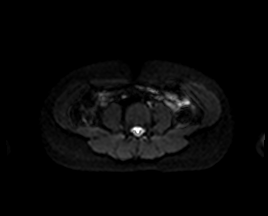
[im 30/90]
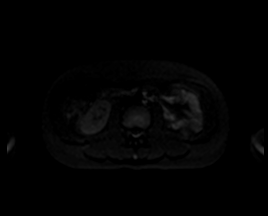
[im 40/90]
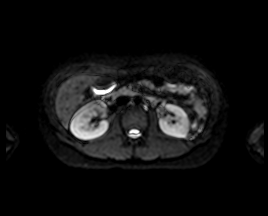
[im 50/90]
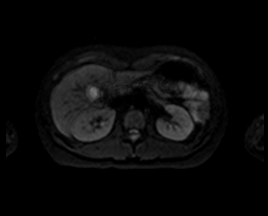
[im 60/90]
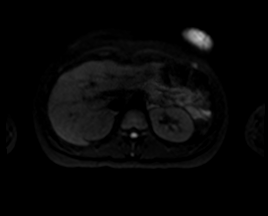
[im 80/90]
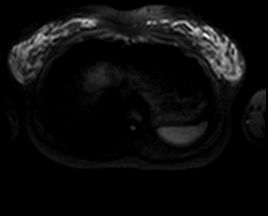
[im 90/90]
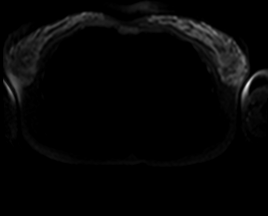

[Series 10: ax dwi_adc · axial · 6.0mm · 1.31mm/px · z∈[-32,+176]mm · 3 of 30 slices shown]
[im 1/30]
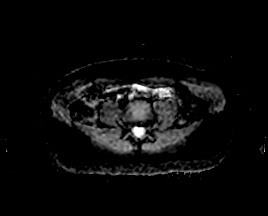
[im 15/30]
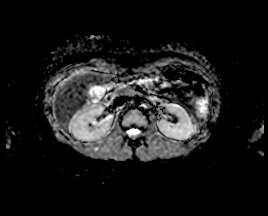
[im 30/30]
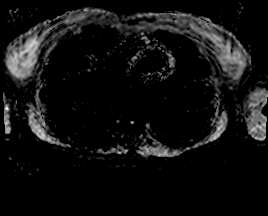

[Series 14: MRCP · coronal · 3.0mm · 1.09mm/px · 2 of 17 slices shown (1 of 2)]
[im 1/17]
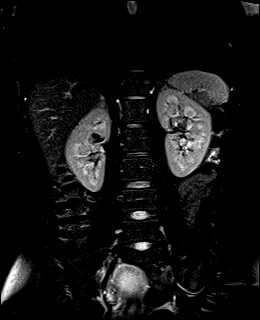
[im 17/17]
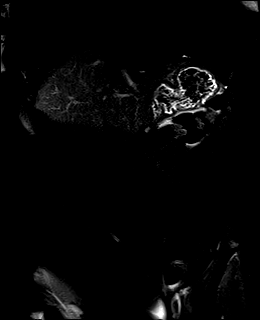

[Series 15: radials · coronal · 50.0mm · 0.86mm/px · 1 of 5 slices shown]
[im 1/5]
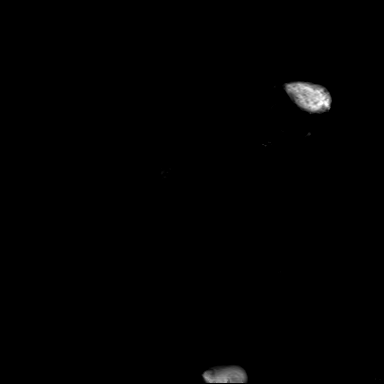

[Series 16: T1 dynamic fat-sat · axial · non-contrast · 3.0mm · 1.09mm/px · z∈[-34,+179]mm · 8 of 72 slices shown]
[im 1/72]
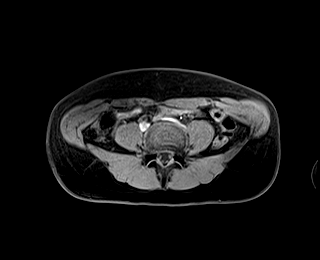
[im 11/72]
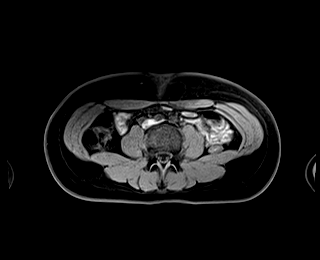
[im 21/72]
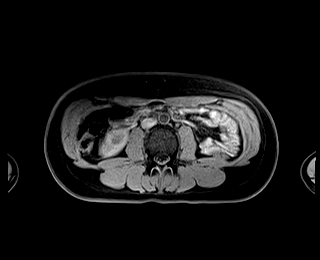
[im 31/72]
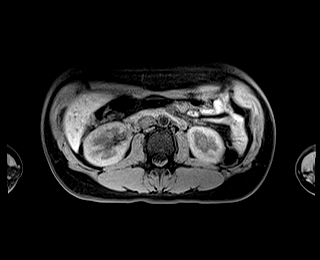
[im 41/72]
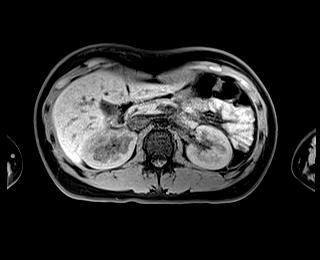
[im 51/72]
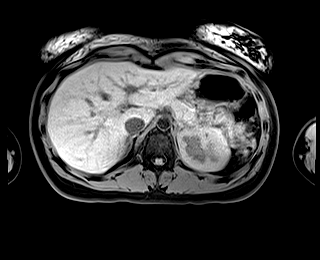
[im 61/72]
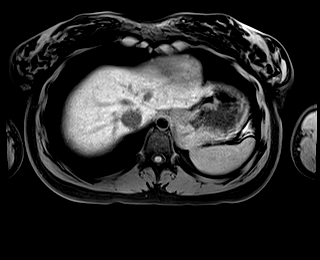
[im 72/72]
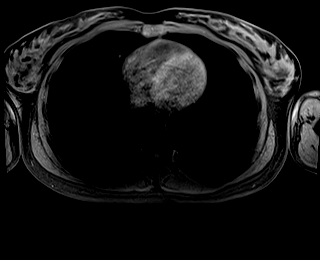

[Series 1026: MRCP · sagittal · 0.5mm · 0.23mm/px · 1 of 19 slices shown (2 of 2)]
[im 1/19]
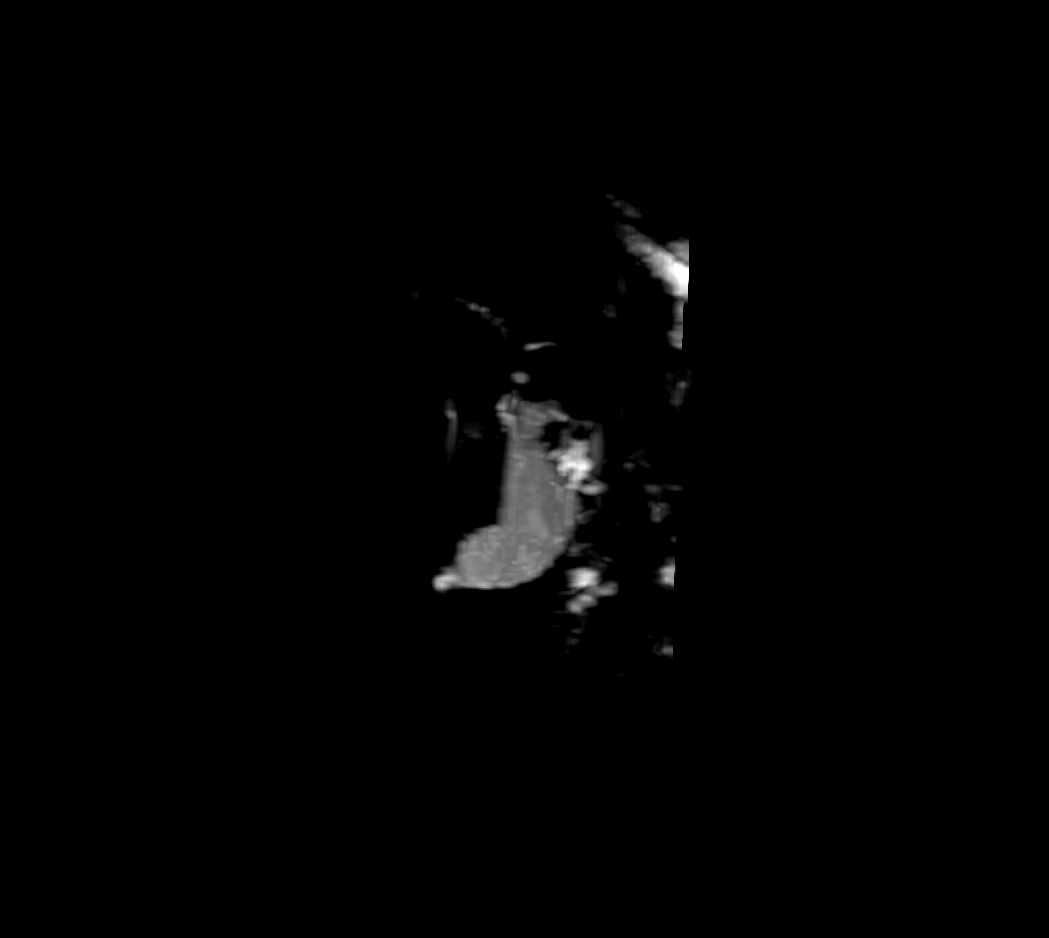

[38 of 48 positions shown; findings below may reference images not displayed]

FINDINGS: Lower chest: Lung bases are clear.

Hepatobiliary: Liver is within normal limits.

Gallbladder is unremarkable. No intrahepatic or extrahepatic ductal
dilatation. Common duct measures 4 mm. No choledocholithiasis is
seen.

Pancreas: Within normal limits. No peripancreatic fluid/inflammatory
changes.

Spleen:  Within normal limits.

Adrenals/Urinary Tract:  Adrenal glands are within normal limits.

Kidneys are within normal limits.  No hydronephrosis.

Stomach/Bowel: Stomach is within normal limits. Visualized bowel is
unremarkable.

Vascular/Lymphatic:  No evidence of abdominal aortic aneurysm.

No suspicious abdominal lymphadenopathy.

Other:  No abdominal ascites.

Musculoskeletal: No focal osseous lesions.
IMPRESSION: Negative MRI abdomen. Specifically, pancreas is within normal
limits. No cholelithiasis or choledocholithiasis.

## 2020-06-21 MED ORDER — POTASSIUM CHLORIDE 10 MEQ/100ML IV SOLN
10.0000 meq | INTRAVENOUS | Status: AC
  Administered 2020-06-21 – 2020-06-22 (×6): 10 meq via INTRAVENOUS
  Filled 2020-06-21 (×6): qty 100

## 2020-06-21 MED ORDER — LACTATED RINGERS IV BOLUS
1000.0000 mL | Freq: Once | INTRAVENOUS | Status: AC
  Administered 2020-06-21: 1000 mL via INTRAVENOUS

## 2020-06-21 MED ORDER — PROMETHAZINE HCL 25 MG RE SUPP: 12.5000 mg | RECTAL | Status: DC | PRN

## 2020-06-21 MED ORDER — THIAMINE HCL 100 MG/ML IJ SOLN
INTRAVENOUS | Status: AC
  Filled 2020-06-21: qty 1000

## 2020-06-21 MED ORDER — POTASSIUM CHLORIDE 2 MEQ/ML IV SOLN
INTRAVENOUS | Status: DC
  Filled 2020-06-21 (×5): qty 1000

## 2020-06-21 MED ORDER — HYDROXYZINE HCL 25 MG PO TABS: 50.0000 mg | ORAL_TABLET | Freq: Four times a day (QID) | ORAL | Status: DC | PRN

## 2020-06-21 MED ORDER — PROMETHAZINE HCL 25 MG PO TABS
12.5000 mg | ORAL_TABLET | ORAL | Status: DC | PRN
  Administered 2020-06-22 (×2): 25 mg via ORAL
  Filled 2020-06-21: qty 2
  Filled 2020-06-21 (×3): qty 1

## 2020-06-21 MED ORDER — ONDANSETRON 4 MG PO TBDP: 4.0000 mg | ORAL_TABLET | Freq: Three times a day (TID) | ORAL | Status: DC | PRN

## 2020-06-21 MED ORDER — ONDANSETRON HCL 4 MG/2ML IJ SOLN: 4.0000 mg | Freq: Three times a day (TID) | INTRAMUSCULAR | Status: DC | PRN

## 2020-06-21 MED ORDER — KCL-LACTATED RINGERS 20 MEQ/L IV SOLN
INTRAVENOUS | Status: DC
  Filled 2020-06-21 (×2): qty 1000

## 2020-06-21 MED ORDER — SODIUM CHLORIDE 0.9 % IV SOLN
8.0000 mg | Freq: Three times a day (TID) | INTRAVENOUS | Status: DC | PRN
  Filled 2020-06-21: qty 4

## 2020-06-21 MED ORDER — CALCIUM CARBONATE ANTACID 500 MG PO CHEW
2.0000 | CHEWABLE_TABLET | ORAL | Status: DC | PRN
  Administered 2020-06-22: 400 mg via ORAL
  Filled 2020-06-21: qty 2

## 2020-06-21 MED ORDER — ACETAMINOPHEN 325 MG PO TABS
650.0000 mg | ORAL_TABLET | ORAL | Status: DC | PRN
  Administered 2020-06-23 (×2): 650 mg via ORAL
  Filled 2020-06-21 (×3): qty 2

## 2020-06-21 MED ORDER — HYDROXYZINE HCL 50 MG/ML IM SOLN
50.0000 mg | Freq: Four times a day (QID) | INTRAMUSCULAR | Status: DC | PRN
  Filled 2020-06-21: qty 1

## 2020-06-21 MED ORDER — DEXTROSE IN LACTATED RINGERS 5 % IV SOLN: INTRAVENOUS | Status: DC

## 2020-06-21 NOTE — H&P (Signed)
HISTORY AND PHYSICAL NOTE  History of Present Illness: Rajni Holsworth is a 19 y.o. G1P0 at 75w0dadmitted for hyperemesis gravidarum.    Nessa reports severe n/v with pregnancy for the past month.  She has attempted to control her n/v with different antiemetics and has yet to have relief.  She has tried diclegis, phenergan, zofran, and reglan.  Lisanne has also had multiple visits to the ED for IV hydration and IV medications.  Her prepregnant weight was 120lbs.  On 05/31/2020 she weighed 113lbs.  She now weighs 101 lbs.  She does not report any alleviating factors.  States that she has become progressively fatigued and feels too weak to complete ADLs.  Also reports history of heavy marijuana use.  She has tried to quite about a month ago when she found out she was pregnant.    Social concerns present: PKeyarahhas a difficult time with resources.  She does not have family that she can use for support.  She was involved in the foster care system until she was 19years old and then was released after her birthday.  She currently has a case mFreight forwarderthat she is working with to help with resources.  She has had housing, safety, and food insecurities over the past couple of years.  She currently has a safe place to stay but it is not a long term option.  Arbie is unsure who the father of the baby is.  One possibility for the father of baby was killed 2 weeks ago, funeral was day of admission.     Patient Active Problem List   Diagnosis Date Noted  . Hyperemesis complicating pregnancy, antepartum 06/21/2020  . Encounter for supervision of normal first pregnancy in first trimester 06/09/2020  . PTSD (post-traumatic stress disorder) 01/20/2016    Past Medical History:  Diagnosis Date  . Aggressive behavior of adolescent     Past Surgical History:  Procedure Laterality Date  . NO PAST SURGERIES      OB History  Gravida Para Term Preterm AB Living  1            SAB TAB Ectopic Multiple  Live Births               # Outcome Date GA Lbr Len/2nd Weight Sex Delivery Anes PTL Lv  1 Current            Social History:  reports that she has never smoked. She has never used smokeless tobacco. She reports current alcohol use. She reports current drug use. Drug: Marijuana.   Family History  Problem Relation Age of Onset  . Schizophrenia Mother   . Alcohol abuse Mother   . Schizophrenia Father   . Alcohol abuse Father     No Known Allergies  Medications Prior to Admission  Medication Sig Dispense Refill Last Dose  . metoCLOPramide (REGLAN) 10 MG tablet Take 1 tablet (10 mg total) by mouth every 6 (six) hours as needed for nausea. 30 tablet 0 06/21/2020 at Unknown time  . Prenatal Vit-Fe Fumarate-FA (PRENATAL MULTIVITAMIN) TABS tablet Take 1 tablet by mouth daily at 12 noon.   06/20/2020 at Unknown time  . cetirizine (ZYRTEC) 10 MG tablet Take 10 mg by mouth daily. (Patient not taking: Reported on 06/21/2020)   Not Taking at Unknown time  . Cholecalciferol (VITAMIN D3) 2000 units capsule Take 2,000 Units by mouth daily. (Patient not taking: Reported on 06/21/2020)   Not Taking at Unknown time  . Doxylamine-Pyridoxine 10-10  MG TBEC 1 tablet at bedtime for 2 nights and then increase to 2 tablets at bedtime for nausea (Patient not taking: Reported on 06/21/2020) 60 tablet 0 Not Taking at Unknown time  . Halobetasol-Ammonium Lactate 0.05 & 12 % (Cream) KIT Apply 1 application topically 2 (two) times daily. (Patient not taking: Reported on 06/21/2020)   Not Taking at Unknown time  . tretinoin (RETIN-A) 0.025 % cream Apply 1 application topically at bedtime. (Patient not taking: Reported on 06/21/2020)   Not Taking at Unknown time  . triamcinolone cream (KENALOG) 0.1 % Apply 1 application topically 2 (two) times daily. (Patient not taking: Reported on 06/21/2020)   Not Taking at Unknown time    Review of Systems  Review of Systems  Constitutional: Positive for malaise/fatigue and weight loss.  Negative for chills and fever.  Respiratory: Negative for cough.   Cardiovascular: Negative for chest pain.  Gastrointestinal: Positive for nausea and vomiting. Negative for diarrhea.  Genitourinary: Negative for dysuria and urgency.  Musculoskeletal: Positive for myalgias.  Skin: Negative for rash.  Neurological: Positive for weakness. Negative for dizziness.  Psychiatric/Behavioral: Positive for depression. The patient is nervous/anxious.      Vitals:  BP 111/71 (BP Location: Left Arm)   Pulse 84   Temp 99 F (37.2 C) (Oral)   Resp 18   Ht 5' (1.524 m)   Wt 46 kg   LMP 04/12/2020   SpO2 100%   BMI 19.81 kg/m  Physical Examination: Physical Exam Constitutional:      Appearance: She is ill-appearing.  Cardiovascular:     Rate and Rhythm: Normal rate and regular rhythm.  Abdominal:     General: Bowel sounds are normal.     Palpations: Abdomen is soft.     Comments: Gravid   Genitourinary:    Comments: Pelvic exam deferred  Musculoskeletal:        General: Normal range of motion.     Cervical back: Normal range of motion.  Skin:    General: Skin is warm and dry.     Capillary Refill: Capillary refill takes less than 2 seconds.     Coloration: Skin is pale.  Neurological:     Mental Status: She is alert and oriented to person, place, and time.  Psychiatric:        Attention and Perception: Attention normal.        Mood and Affect: Mood is depressed.        Behavior: Behavior normal.     Comments: Tearful when discussing concerns    Labs:  Results for orders placed or performed during the hospital encounter of 06/21/20 (from the past 24 hour(s))  CBC on admission   Collection Time: 06/21/20  5:19 PM  Result Value Ref Range   WBC 5.2 4.0 - 10.5 K/uL   RBC 4.92 3.87 - 5.11 MIL/uL   Hemoglobin 14.7 12.0 - 15.0 g/dL   HCT 41.5 36 - 46 %   MCV 84.3 80.0 - 100.0 fL   MCH 29.9 26.0 - 34.0 pg   MCHC 35.4 30.0 - 36.0 g/dL   RDW 11.9 11.5 - 15.5 %   Platelets 336  150 - 400 K/uL   nRBC 0.0 0.0 - 0.2 %  Comprehensive metabolic panel   Collection Time: 06/21/20  5:19 PM  Result Value Ref Range   Sodium 133 (L) 135 - 145 mmol/L   Potassium 2.7 (LL) 3.5 - 5.1 mmol/L   Chloride 93 (L) 98 - 111 mmol/L  CO2 22 22 - 32 mmol/L   Glucose, Bld 111 (H) 70 - 99 mg/dL   BUN 10 6 - 20 mg/dL   Creatinine, Ser 0.52 0.44 - 1.00 mg/dL   Calcium 10.0 8.9 - 10.3 mg/dL   Total Protein 8.6 (H) 6.5 - 8.1 g/dL   Albumin 4.6 3.5 - 5.0 g/dL   AST 130 (H) 15 - 41 U/L   ALT 297 (H) 0 - 44 U/L   Alkaline Phosphatase 58 38 - 126 U/L   Total Bilirubin 1.9 (H) 0.3 - 1.2 mg/dL   GFR calc non Af Amer >60 >60 mL/min   GFR calc Af Amer >60 >60 mL/min   Anion gap 18 (H) 5 - 15  Lipase, blood   Collection Time: 06/21/20  5:19 PM  Result Value Ref Range   Lipase 585 (H) 11 - 51 U/L  Amylase   Collection Time: 06/21/20  5:19 PM  Result Value Ref Range   Amylase 599 (H) 28 - 100 U/L  Magnesium   Collection Time: 06/21/20  5:19 PM  Result Value Ref Range   Magnesium 1.9 1.7 - 2.4 mg/dL  SARS Coronavirus 2 by RT PCR (hospital order, performed in [place] hospital lab) Nasopharyngeal Nasopharyngeal Swab   Collection Time: 06/21/20  5:53 PM   Specimen: Nasopharyngeal Swab  Result Value Ref Range   SARS Coronavirus 2 NEGATIVE NEGATIVE    Imaging Studies: US Abdomen Complete  Result Date: 06/21/2020 CLINICAL DATA:  Abdominal pain, elevated serum lipase, pregnant EXAM: ABDOMEN ULTRASOUND COMPLETE COMPARISON:  None. FINDINGS: Gallbladder: The gallbladder contains sludge, however, the gallbladder is not distended, there is no gallbladder wall thickening, and no pericholecystic fluid is identified. The sonographic Percell Miller sign is reportedly negative. Common bile duct: Diameter: 3 mm in proximal diameter Liver: No focal lesion identified. Within normal limits in parenchymal echogenicity. Portal vein is patent on color Doppler imaging with normal direction of blood flow towards the  liver. IVC: No abnormality visualized. Pancreas: Visualized portion unremarkable. Spleen: Size and appearance within normal limits. Right Kidney: Length: 9.4 cm. Echogenicity within normal limits. No mass or hydronephrosis visualized. Left Kidney: Length: 9.7 cm. Echogenicity within normal limits. No mass or hydronephrosis visualized. Abdominal aorta: No aneurysm visualized. Other findings: None. IMPRESSION: 1. Gallbladder sludge without evidence of acute cholecystitis. 2. Otherwise unremarkable abdominal ultrasound. Electronically Signed   By: Fidela Salisbury MD   On: 06/21/2020 21:19   US OB LESS THAN 14 WEEKS WITH OB TRANSVAGINAL  Result Date: 06/01/2020 CLINICAL DATA:  Initial evaluation for acute nausea, vomiting, syncope. Early pregnancy. EXAM: OBSTETRIC <14 WK [place] AND TRANSVAGINAL OB US TECHNIQUE: Both transabdominal and transvaginal ultrasound examinations were performed for complete evaluation of the gestation as well as the maternal uterus, adnexal regions, and pelvic cul-de-sac. Transvaginal technique was performed to assess early pregnancy. COMPARISON:  Prior ultrasound from 05/24/2020. FINDINGS: Intrauterine gestational sac: Signal Yolk sac:  Present Embryo:  Present Cardiac Activity: Present Heart Rate: 142 bpm CRL: 8.5 mm   6 w   5 d                  [place] EDC: 01/19/2021 Subchorionic hemorrhage:  None visualized. Maternal uterus/adnexae: Right ovary normal in appearance. Left ovary not visualized. No adnexal mass. Trace free fluid noted within the pelvis. IMPRESSION: 1. Single viable intrauterine pregnancy as above, estimated gestational age [redacted] weeks and 5 days by crown-rump length, with ultrasound EDC of 01/19/2021. No complication. 2. No other acute maternal uterine or  adnexal abnormality identified. Electronically Signed   By: Jeannine Boga M.D.   On: 06/01/2020 01:10   US OB LESS THAN 14 WEEKS WITH OB TRANSVAGINAL  Result Date: 05/24/2020 CLINICAL DATA:  Lower abdominal pain EXAM:  OBSTETRIC <14 WK [place] AND TRANSVAGINAL OB US TECHNIQUE: Both transabdominal and transvaginal ultrasound examinations were performed for complete evaluation of the gestation as well as the maternal uterus, adnexal regions, and pelvic cul-de-sac. Transvaginal technique was performed to assess early pregnancy. COMPARISON:  None. FINDINGS: Intrauterine gestational sac: Single Yolk sac:  Visualized. Embryo:  Visualized. Cardiac Activity: Visualized. Heart Rate: 110 bpm CRL: 2.8  mm   5 w   6 d                  [place] EDC: 01/18/2021 Subchorionic hemorrhage:  None visualized. Maternal uterus/adnexae: Normal bilateral ovaries. Trace amount of free fluid around the left ovary. No adnexal mass. IMPRESSION: Single live intrauterine pregnancy as detailed above. Electronically Signed   By: Kathreen Devoid   On: 05/24/2020 10:59    Assessment and Plan: Patient Active Problem List   Diagnosis Date Noted  . Hyperemesis complicating pregnancy, antepartum 06/21/2020  . Encounter for supervision of normal first pregnancy in first trimester 06/09/2020  . PTSD (post-traumatic stress disorder) 01/20/2016   Plan:  Admit for monitoring and hydration. - Discussed admission with Dr. Leafy Ro - Will consult hospital team d/t elevated amylase, lipase, liver enzymes, and hypokalemia  - Will change to NPO status   Management of electrolyte imbalance:  - IV hydration with LR and banana bag  - Consult with hospital team for correction of hypokalemia   Hyperemesis gravidarum: - Consider glucocorticoids if n/v remains refractory to antiemetics  - Strict I/O's - Daily weights   Social concerns: - TOC consult to assess for gaps in resources  - Concern present for depression and anxiety - uses marijuana to self treat PTSD/anxiety    Drinda Butts, CNM Certified Nurse Midwife [facility]

## 2020-06-21 NOTE — Consult Note (Addendum)
Medical Consultation   Brianna Lloyd  POE:[id number]  DOB: 06-04-2001  DOA: 06/21/2020   Requesting physician: Margaretmary Eddy CNM  Reason for consultation: Hypokalemia, pancreatitis  History of Present Illness: Brianna Lloyd is an 19 y.o. female with history of asthma as a child but has no symptoms and does not use medications for it at this time. She is [redacted] weeks pregnant.  She was admitted by the OB/GYN service with hyperemesis gravidarum.  She reports having abdominal pain with nausea and vomiting for the last month.  Vomiting abdominal pain worsened in the last few days and she is not been to tolerate any p.o. intake.  She reportedly has lost 12 to 13 pounds in the last month.  She has been to the emergency room to receive IV fluids twice in the last month she states due to the nausea and vomiting.  With her abdominal pain, profound vomiting and weight loss OB service admitted her today.  Labs reveal decreased potassium level and elevated lipase and amylase level.  She reports abdominal pain is in the mid and lower abdominal region and does not radiate.  She states it is a sharp pain.  She has not noted any alleviating factors were.  Eating aggravates her nausea and vomiting. She does report history of heavy marijuana use but states she quit a month ago when she found out she was pregnant.  She has history of drinking alcohol in the past but has not drank in the last month since found out she was pregnant.  She has no history of pancreatitis or GI conditions.      Review of Systems:  Positive for abdominal pain, nausea, vomiting, generalized weakness and intermittent dizziness when stands up.  Denies diarrhea.  She denies chest pain or palpitation.  She denies fever, chills, shortness of breath, cough. As per HPI otherwise 10 point review of systems negative.     Past Medical History: Past Medical History:  Diagnosis Date  . Aggressive behavior of adolescent      Past Surgical History: Past Surgical History:  Procedure Laterality Date  . NO PAST SURGERIES       Allergies:  No Known Allergies   Social History:  reports that she has never smoked. She has never used smokeless tobacco. She reports current alcohol use. She reports current drug use. Drug: Marijuana.   Family History: Family History  Problem Relation Age of Onset  . Schizophrenia Mother   . Alcohol abuse Mother   . Schizophrenia Father   . Alcohol abuse Father     Unacceptable: Noncontributory, unremarkable, or negative. Acceptable: Family history reviewed and not pertinent (If you reviewed it)   Physical Exam: Vitals:   06/21/20 1730 06/21/20 1938  BP: 106/75 109/68  Pulse: 91 81  Resp: 18 18  Temp: 98.3 F (36.8 C) 98.5 F (36.9 C)  TempSrc: Oral Oral  SpO2: 100% 100%  Weight: 46 kg   Height: 5' (1.524 m)     Constitutional: Alert and awake, oriented x3, not in any acute respiratory distress. Eyes: PERLA, EOMI, irises appear normal, anicteric sclera, no nystagmus ENMT: external ears and nose appear normal,  Lips appears normal, oropharynx mucosa dry, tongue without lesions.  Posterior pharynx appear normal  Neck: neck appears normal, no masses, normal ROM, no thyromegaly  CVS: Regular rate and rhythm, no murmur rubs or gallops, no LE edema Respiratory:  clear to auscultation bilaterally,  no wheezing, rales or rhonchi. Respiratory effort normal. No accessory muscle use.  Abdomen: soft, diffuse abdominal tenderness to palpation.  Nondistended, hyperactive bowel sounds, no hepatosplenomegaly Musculoskeletal: FROM, no deformities of extremities or joints noted.  No cyanosis, clubbing or edema noted bilaterally Neuro: Cranial nerves II-XII intact, strength 5 out of 5 bilaterally, sensation intact, patella DTR +2 bilaterally Psych: judgement and insight appear normal, stable mood and affect, mental status Skin: no rashes or lesions or ulcers, no induration or  nodules   Data reviewed:  I have personally reviewed following labs and imaging studies Labs:  CBC: Recent Labs  Lab 06/21/20 1719  WBC 5.2  HGB 14.7  HCT 41.5  MCV 84.3  PLT 336    Basic Metabolic Panel: Recent Labs  Lab 06/21/20 1719  NA 133*  K 2.7*  CL 93*  CO2 22  GLUCOSE 111*  BUN 10  CREATININE 0.52  CALCIUM 10.0  MG 1.9   GFR Estimated Creatinine Clearance: 80.6 mL/min (by C-G formula based on SCr of 0.52 mg/dL). Liver Function Tests: Recent Labs  Lab 06/21/20 1719  AST 130*  ALT 297*  ALKPHOS 58  BILITOT 1.9*  PROT 8.6*  ALBUMIN 4.6   Recent Labs  Lab 06/21/20 1719  LIPASE 585*  AMYLASE 599*   No results for input(s): AMMONIA in the last 168 hours. Coagulation profile No results for input(s): INR, PROTIME in the last 168 hours.  Cardiac Enzymes: No results for input(s): CKTOTAL, CKMB, CKMBINDEX, TROPONINI in the last 168 hours. BNP: Invalid input(s): POCBNP CBG: No results for input(s): GLUCAP in the last 168 hours. D-Dimer No results for input(s): DDIMER in the last 72 hours. Hgb A1c No results for input(s): HGBA1C in the last 72 hours. Lipid Profile No results for input(s): CHOL, HDL, LDLCALC, TRIG, CHOLHDL, LDLDIRECT in the last 72 hours. Thyroid function studies No results for input(s): TSH, T4TOTAL, T3FREE, THYROIDAB in the last 72 hours.  Invalid input(s): FREET3 Anemia work up No results for input(s): VITAMINB12, FOLATE, FERRITIN, TIBC, IRON, RETICCTPCT in the last 72 hours. Urinalysis    Component Value Date/Time   COLORURINE YELLOW (A) 06/01/2020 0458   APPEARANCEUR CLOUDY (A) 06/01/2020 0458   LABSPEC 1.030 06/01/2020 0458   PHURINE 5.0 06/01/2020 0458   GLUCOSEU NEGATIVE 06/01/2020 0458   HGBUR LARGE (A) 06/01/2020 0458   BILIRUBINUR NEGATIVE 06/01/2020 0458   KETONESUR 80 (A) 06/01/2020 0458   PROTEINUR >=300 (A) 06/01/2020 0458   NITRITE NEGATIVE 06/01/2020 0458   LEUKOCYTESUR SMALL (A) 06/01/2020 0458      Microbiology Recent Results (from the past 240 hour(s))  SARS Coronavirus 2 by RT PCR (hospital order, performed in [facility] hospital lab) Nasopharyngeal Nasopharyngeal Swab     Status: None   Collection Time: 06/21/20  5:53 PM   Specimen: Nasopharyngeal Swab  Result Value Ref Range Status   SARS Coronavirus 2 NEGATIVE NEGATIVE Final    Comment: (NOTE) SARS-CoV-2 target nucleic acids are NOT DETECTED.  The SARS-CoV-2 RNA is generally detectable in upper and lower respiratory specimens during the acute phase of infection. The lowest concentration of SARS-CoV-2 viral copies this assay can detect is 250 copies / mL. A negative result does not preclude SARS-CoV-2 infection and should not be used as the sole basis for treatment or other patient management decisions.  A negative result may occur with improper specimen collection / handling, submission of specimen other than nasopharyngeal swab, presence of viral mutation(s) within the areas targeted by this assay, and inadequate  number of viral copies (<250 copies / mL). A negative result must be combined with clinical observations, patient history, and epidemiological information.  Fact Sheet for Patients:   BoilerBrush.com.cy  Fact Sheet for Healthcare Providers: https://pope.com/  This test is not yet approved or  cleared by the [place] FDA and has been authorized for detection and/or diagnosis of SARS-CoV-2 by FDA under an Emergency Use Authorization (EUA).  This EUA will remain in effect (meaning this test can be used) for the duration of the COVID-19 declaration under Section 564(b)(1) of the Act, 21 U.S.C. section 360bbb-3(b)(1), unless the authorization is terminated or revoked sooner.  Performed at [facility], [address] [place], [place] [address]        Inpatient Medications:   Scheduled Meds: Continuous Infusions: . lactated ringers  with kcl    . ondansetron (ZOFRAN) IV    . potassium chloride       Radiological Exams on Admission: No results found.  Impression/Recommendations Active Problems:   Hyperemesis complicating pregnancy, antepartum Admitted by OB/GYN service.  Receiving IV fluid hydration and received LR bolus already.  Admitting service ordered a banana bag and. Antiemetics per admitting service     Pancreatitis Will obtain abdominal ultrasound to evaluate gallbladder biliary system and pancreas. Lipase and amylase are both elevated.  would obtain MRI of abdomen to further evaluate pancreas for cyst, necrosis, obstruction, pathology.  CT cannot be secondary to radiation but MRI is safe to use in pregnancy as there is no radiation involved with the test.  Hypokalemia Potassium is 2.7.  Potassium is being repleted with 60 mEq IV now.  IV fluid to be changed to LR with 20 mg of potassium at 100 mils per hour overnight.  Magnesium level was checked and is normal.   Thank you for this consultation.  Our [facility] hospitalist team will follow the patient with you.   Time Spent: 40 minutes  Carlton Adam M.D. [facility] 06/21/2020, 8:30 PM

## 2020-06-22 DIAGNOSIS — O211 Hyperemesis gravidarum with metabolic disturbance: Secondary | ICD-10-CM | POA: Diagnosis present

## 2020-06-22 DIAGNOSIS — Z20822 Contact with and (suspected) exposure to covid-19: Secondary | ICD-10-CM | POA: Diagnosis present

## 2020-06-22 DIAGNOSIS — F4320 Adjustment disorder, unspecified: Secondary | ICD-10-CM | POA: Diagnosis present

## 2020-06-22 DIAGNOSIS — F411 Generalized anxiety disorder: Secondary | ICD-10-CM | POA: Diagnosis present

## 2020-06-22 DIAGNOSIS — F259 Schizoaffective disorder, unspecified: Secondary | ICD-10-CM | POA: Diagnosis present

## 2020-06-22 DIAGNOSIS — O99611 Diseases of the digestive system complicating pregnancy, first trimester: Secondary | ICD-10-CM | POA: Diagnosis present

## 2020-06-22 DIAGNOSIS — O99341 Other mental disorders complicating pregnancy, first trimester: Secondary | ICD-10-CM | POA: Diagnosis present

## 2020-06-22 DIAGNOSIS — R45851 Suicidal ideations: Secondary | ICD-10-CM | POA: Diagnosis present

## 2020-06-22 DIAGNOSIS — O210 Mild hyperemesis gravidarum: Secondary | ICD-10-CM | POA: Diagnosis present

## 2020-06-22 DIAGNOSIS — K851 Biliary acute pancreatitis without necrosis or infection: Secondary | ICD-10-CM | POA: Diagnosis present

## 2020-06-22 DIAGNOSIS — Z3A10 10 weeks gestation of pregnancy: Secondary | ICD-10-CM | POA: Diagnosis not present

## 2020-06-22 DIAGNOSIS — F4310 Post-traumatic stress disorder, unspecified: Secondary | ICD-10-CM | POA: Diagnosis present

## 2020-06-22 LAB — COMPREHENSIVE METABOLIC PANEL
ALT: 228 U/L — ABNORMAL HIGH (ref 0–44)
AST: 99 U/L — ABNORMAL HIGH (ref 15–41)
Albumin: 3.4 g/dL — ABNORMAL LOW (ref 3.5–5.0)
Alkaline Phosphatase: 45 U/L (ref 38–126)
Anion gap: 8 (ref 5–15)
BUN: 6 mg/dL (ref 6–20)
CO2: 19 mmol/L — ABNORMAL LOW (ref 22–32)
Calcium: 8.2 mg/dL — ABNORMAL LOW (ref 8.9–10.3)
Chloride: 105 mmol/L (ref 98–111)
Creatinine, Ser: 0.57 mg/dL (ref 0.44–1.00)
GFR calc Af Amer: >60 mL/min (ref >60)
GFR calc non Af Amer: >60 mL/min (ref >60)
Glucose, Bld: 84 mg/dL (ref 70–99)
Potassium: 3.3 mmol/L — ABNORMAL LOW (ref 3.5–5.1)
Sodium: 132 mmol/L — ABNORMAL LOW (ref 135–145)
Total Bilirubin: 1.7 mg/dL — ABNORMAL HIGH (ref 0.3–1.2)
Total Protein: 6.1 g/dL — ABNORMAL LOW (ref 6.5–8.1)

## 2020-06-22 LAB — HEPATITIS PANEL, ACUTE
HCV Ab: NONREACTIVE
Hep A IgM: NONREACTIVE
Hep B C IgM: NONREACTIVE
Hepatitis B Surface Ag: NONREACTIVE

## 2020-06-22 LAB — LIPASE, BLOOD: Lipase: 896 U/L — ABNORMAL HIGH (ref 11–51)

## 2020-06-22 MED ORDER — MELATONIN 5 MG PO TABS
5.0000 mg | ORAL_TABLET | Freq: Every evening | ORAL | Status: DC | PRN
  Administered 2020-06-22: 5 mg via ORAL
  Filled 2020-06-22 (×3): qty 1

## 2020-06-22 MED ORDER — ONDANSETRON 4 MG PO TBDP
4.0000 mg | ORAL_TABLET | Freq: Four times a day (QID) | ORAL | Status: DC
  Administered 2020-06-22 – 2020-06-23 (×5): 4 mg via ORAL
  Filled 2020-06-22 (×5): qty 1

## 2020-06-22 MED ORDER — FAMOTIDINE 20 MG PO TABS
20.0000 mg | ORAL_TABLET | Freq: Two times a day (BID) | ORAL | Status: DC
  Administered 2020-06-22 – 2020-06-23 (×2): 20 mg via ORAL
  Filled 2020-06-22 (×2): qty 1

## 2020-06-22 MED ORDER — SODIUM CHLORIDE 0.9 % IV SOLN
INTRAVENOUS | Status: DC | PRN
  Administered 2020-06-22: 500 mL via INTRAVENOUS
  Administered 2020-06-22: 1000 mL via INTRAVENOUS
  Administered 2020-06-23: 500 mL via INTRAVENOUS

## 2020-06-22 MED ORDER — SODIUM CHLORIDE 0.9 % IV BOLUS
500.0000 mL | Freq: Once | INTRAVENOUS | Status: AC
  Administered 2020-06-22: 500 mL via INTRAVENOUS

## 2020-06-22 MED ORDER — NON FORMULARY: 3.0000 mg | Freq: Every evening | Status: DC | PRN

## 2020-06-22 MED ORDER — METRONIDAZOLE IN NACL 5-0.79 MG/ML-% IV SOLN
500.0000 mg | Freq: Two times a day (BID) | INTRAVENOUS | Status: DC
  Administered 2020-06-22 – 2020-06-23 (×3): 500 mg via INTRAVENOUS
  Filled 2020-06-22 (×5): qty 100

## 2020-06-22 MED ORDER — MORPHINE SULFATE (PF) 2 MG/ML IV SOLN: 2.0000 mg | INTRAVENOUS | Status: DC | PRN

## 2020-06-22 MED ORDER — POTASSIUM CHLORIDE CRYS ER 20 MEQ PO TBCR
40.0000 meq | EXTENDED_RELEASE_TABLET | Freq: Once | ORAL | Status: AC
  Administered 2020-06-22: 40 meq via ORAL
  Filled 2020-06-22: qty 2

## 2020-06-22 MED ORDER — FAMOTIDINE IN NACL 20-0.9 MG/50ML-% IV SOLN
20.0000 mg | Freq: Every day | INTRAVENOUS | Status: DC
  Administered 2020-06-22: 20 mg via INTRAVENOUS
  Filled 2020-06-22: qty 50

## 2020-06-22 NOTE — Consult Note (Signed)
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 female patient admitted with   Hyperemesis, now still a problem, with resultant N and V, along with pancreatitis signs and symptoms where further workup pending. Patient at 10-11 weeks.     HPI:  As above  ---supportive therapy was required in general   Past Psychiatric History: none   Risk to Self:  none  Risk to Others:  none  Prior Inpatient Therapy:  none  Prior Outpatient Therapy:  none   Past Medical History:  Past Medical History:  Diagnosis Date   Aggressive behavior of adolescent     Past Surgical History:  Procedure Laterality Date   NO PAST SURGERIES     Family History:  Family History  Problem Relation Age of Onset   Schizophrenia Mother    Alcohol abuse Mother    Schizophrenia Father    Alcohol abuse Father    Family Psychiatric  History: none  Social History:  Social History   Substance and Sexual Activity  Alcohol Use Yes     Social History   Substance and Sexual Activity  Drug Use Yes   Types: Marijuana    Social History   Socioeconomic History   Marital status: Single    Spouse name: Not on file   Number of children: Not on file   Years of education: Not on file   Highest education level: Not on file  Occupational History   Not on file  Tobacco Use   Smoking status: Never Smoker   Smokeless tobacco: Never Used  Vaping Use   Vaping Use: Every day  Substance and Sexual Activity   Alcohol use: Yes   Drug use: Yes    Types: Marijuana   Sexual activity: Not on file   Other Topics Concern   Not on file  Social History Narrative   Not on file   Social Determinants of Health   Financial Resource Strain:    Difficulty of Paying Living Expenses: Not on file  Food Insecurity:    Worried About Running Out of Food in the Last Year: Not on file   [employer] of Food in the Last Year: Not on file  Transportation Needs:    Lack of Transportation (Medical): Not on file   Lack of Transportation (Non-Medical): Not on file  Physical Activity:    Days of Exercise per Week: Not on file   Minutes of Exercise per Session: Not on file  Stress:    Feeling of Stress : Not on file  Social Connections:    Frequency of Communication with Friends and Family: Not on file   Frequency of Social Gatherings with Friends and Family: Not on file   Attends Religious Services: Not on file   Active Member of Clubs or Organizations: Not on file   Attends Banker Meetings: Not on file   Marital Status: Not on file   Additional Social History:    Allergies:  No Known Allergies  Labs:  Results for orders placed or performed during the hospital encounter of 06/21/20 (from  the past 48 hour(s))  CBC on admission     Status: None   Collection Time: 06/21/20  5:19 PM  Result Value Ref Range   WBC 5.2 4.0 - 10.5 K/uL   RBC 4.92 3.87 - 5.11 MIL/uL   Hemoglobin 14.7 12.0 - 15.0 g/dL   HCT 14.4 36 - 46 %   MCV 84.3 80.0 - 100.0 fL   MCH 29.9 26.0 - 34.0 pg   MCHC 35.4 30.0 - 36.0 g/dL   RDW 31.5 40.0 - 86.7 %   Platelets 336 150 - 400 K/uL   nRBC 0.0 0.0 - 0.2 %    Comment: Performed at [facility], [address], [place], [place] [address]  Comprehensive metabolic panel     Status: Abnormal   Collection Time: 06/21/20  5:19 PM  Result Value Ref Range   Sodium 133 (L) 135 - 145 mmol/L   Potassium 2.7 (LL) 3.5 - 5.1 mmol/L    Comment: CRITICAL RESULT CALLED TO, READ BACK BY AND VERIFIED WITH MADDIE BUTLER AT 1750 06/21/20.PMF   Chloride  93 (L) 98 - 111 mmol/L   CO2 22 22 - 32 mmol/L   Glucose, Bld 111 (H) 70 - 99 mg/dL    Comment: Glucose reference range applies only to samples taken after fasting for at least 8 hours.   BUN 10 6 - 20 mg/dL   Creatinine, Ser 9.32 0.44 - 1.00 mg/dL   Calcium 67.1 8.9 - 24.5 mg/dL   Total Protein 8.6 (H) 6.5 - 8.1 g/dL   Albumin 4.6 3.5 - 5.0 g/dL   AST 809 (H) 15 - 41 U/L   ALT 297 (H) 0 - 44 U/L   Alkaline Phosphatase 58 38 - 126 U/L   Total Bilirubin 1.9 (H) 0.3 - 1.2 mg/dL   GFR calc non Af Amer >60 >60 mL/min   GFR calc Af Amer >60 >60 mL/min   Anion gap 18 (H) 5 - 15    Comment: Performed at [facility], [address] [place], [place] [address]  Lipase, blood     Status: Abnormal   Collection Time: 06/21/20  5:19 PM  Result Value Ref Range   Lipase 585 (H) 11 - 51 U/L    Comment: RESULT CONFIRMED BY MANUAL DILUTION.PMF Performed at [facility], [address] [place], [place] [address]   Amylase     Status: Abnormal   Collection Time: 06/21/20  5:19 PM  Result Value Ref Range   Amylase 599 (H) 28 - 100 U/L    Comment: Performed at [facility], [address] [place], [place] [address]  Magnesium     Status: None   Collection Time: 06/21/20  5:19 PM  Result Value Ref Range   Magnesium 1.9 1.7 - 2.4 mg/dL    Comment: Performed at [facility], [address] [place], [place] [address]  SARS Coronavirus 2 by RT PCR (hospital order, performed in [facility] hospital lab) Nasopharyngeal Nasopharyngeal Swab     Status: None   Collection Time: 06/21/20  5:53 PM   Specimen: Nasopharyngeal Swab  Result Value Ref Range   SARS Coronavirus 2 NEGATIVE NEGATIVE    Comment: (NOTE) SARS-CoV-2 target nucleic acids are NOT DETECTED.  The SARS-CoV-2 RNA is generally detectable in upper and lower respiratory specimens during the acute phase of infection. The lowest concentration of SARS-CoV-2 viral copies this assay can detect is  250 copies / mL. A negative result does not  preclude SARS-CoV-2 infection and should not be used as the sole basis for treatment or other patient management decisions.  A negative result may occur with improper specimen collection / handling, submission of specimen other than nasopharyngeal swab, presence of viral mutation(s) within the areas targeted by this assay, and inadequate number of viral copies (<250 copies / mL). A negative result must be combined with clinical observations, patient history, and epidemiological information.  Fact Sheet for Patients:   BoilerBrush.com.cy  Fact Sheet for Healthcare Providers: https://pope.com/  This test is not yet approved or  cleared by the [place] FDA and has been authorized for detection and/or diagnosis of SARS-CoV-2 by FDA under an Emergency Use Authorization (EUA).  This EUA will remain in effect (meaning this test can be used) for the duration of the COVID-19 declaration under Section 564(b)(1) of the Act, 21 U.S.C. section 360bbb-3(b)(1), unless the authorization is terminated or revoked sooner.  Performed at [facility], [address] [place], [place] [address]   Hepatitis panel, acute     Status: None   Collection Time: 06/21/20  9:14 PM  Result Value Ref Range   Hepatitis B Surface Ag NON REACTIVE NON REACTIVE   HCV Ab NON REACTIVE NON REACTIVE    Comment: (NOTE) Nonreactive HCV antibody screen is consistent with no HCV infections,  unless recent infection is suspected or other evidence exists to indicate HCV infection.     Hep A IgM NON REACTIVE NON REACTIVE   Hep B C IgM NON REACTIVE NON REACTIVE    Comment: Performed at [facility], [address] [place], [place] [address]  Comprehensive metabolic panel     Status: Abnormal   Collection Time: 06/22/20  8:59 AM  Result Value Ref Range   Sodium 132 (L) 135 - 145 mmol/L   Potassium 3.3 (L) 3.5 -  5.1 mmol/L   Chloride 105 98 - 111 mmol/L   CO2 19 (L) 22 - 32 mmol/L   Glucose, Bld 84 70 - 99 mg/dL    Comment: Glucose reference range applies only to samples taken after fasting for at least 8 hours.   BUN 6 6 - 20 mg/dL   Creatinine, Ser 0.08 0.44 - 1.00 mg/dL   Calcium 8.2 (L) 8.9 - 10.3 mg/dL   Total Protein 6.1 (L) 6.5 - 8.1 g/dL   Albumin 3.4 (L) 3.5 - 5.0 g/dL   AST 99 (H) 15 - 41 U/L   ALT 228 (H) 0 - 44 U/L   Alkaline Phosphatase 45 38 - 126 U/L   Total Bilirubin 1.7 (H) 0.3 - 1.2 mg/dL   GFR calc non Af Amer >60 >60 mL/min   GFR calc Af Amer >60 >60 mL/min   Anion gap 8 5 - 15    Comment: Performed at [facility], [address], [place], [place] [address]    Current Facility-Administered Medications  Medication Dose Route Frequency Provider Last Rate Last Admin   0.9 %  sodium chloride infusion   Intravenous PRN Gustavo Lah, CNM 200 mL/hr at 06/22/20 0227 Rate Change at 06/22/20 0227   acetaminophen (TYLENOL) tablet 650 mg  650 mg Oral Q4H PRN Gustavo Lah, CNM       calcium carbonate (TUMS - dosed in mg elemental calcium) chewable tablet 400 mg of elemental calcium  2 tablet Oral Q4H PRN Gustavo Lah, CNM   400 mg of elemental calcium at 06/22/20 1359   famotidine (PEPCID) tablet 20  mg  20 mg Oral BID McVey, Rebecca A, CNM       hydrOXYzine (ATARAX/VISTARIL) tablet 50 mg  50 mg Oral Q6H PRN Margaretmary EddyMackie, Anna M, CNM       Or   hydrOXYzine (VISTARIL) injection 50 mg  50 mg Intramuscular Q6H PRN Margaretmary EddyMackie, Anna M, CNM       lactated ringers 1,000 mL with potassium chloride 20 mEq infusion   Intravenous Continuous Chotiner, Claudean SeveranceBradley S, MD 100 mL/hr at 06/22/20 1412 New Bag at 06/22/20 1412   melatonin tablet 5 mg  5 mg Oral QHS PRN Alberteen Samanford, Christopher P, MD       metroNIDAZOLE (FLAGYL) IVPB 500 mg  500 mg Intravenous Q12H Gustavo LahMackie, Anna M, CNM 100 mL/hr at 06/22/20 0837 500 mg at 06/22/20 0837   ondansetron (ZOFRAN) injection 4 mg  4 mg Intravenous Q8H PRN  Margaretmary EddyMackie, Anna M, CNM       ondansetron (ZOFRAN-ODT) disintegrating tablet 4 mg  4 mg Oral Q6H McVey, Rebecca A, CNM   4 mg at 06/22/20 1259   promethazine (PHENERGAN) tablet 12.5-25 mg  12.5-25 mg Oral Q4H PRN Margaretmary EddyMackie, Anna M, CNM       Or   promethazine (PHENERGAN) suppository 12.5-25 mg  12.5-25 mg Rectal Q4H PRN Gustavo LahMackie, Anna M, CNM        Musculoskeletal: Strength & Muscle Tone: within normal limits Gait & Station: normal Patient leans: N/A  Psychiatric Specialty Exam: Physical Exam  Review of Systems  Blood pressure 115/81, pulse 88, temperature 98.9 F (37.2 C), temperature source Oral, resp. rate 18, height 5' (1.524 m), weight 48.1 kg, last menstrual period 04/12/2020, SpO2 100 %.Body mass index is 20.71 kg/m.    Mental Status limited patient was too nauseated and all   Generally no active SI or HI or plans Mood somewhat depressed affect constricted Oriented times four  Consciousness not clouded or fluctuant No active Psychosis or mania  Contracts for safety   Rest of exam limited                                                      Treatment Plan Summary:   Supportive statements made she will follow up with OB gyn as an outpatient   Disposition: was discharged home   Roselind Messieramakrishna Tierra Thoma, MD 06/22/2020 2:26 PM

## 2020-06-22 NOTE — Progress Notes (Signed)
[facility] PROGRESS NOTE    Brianna Lloyd  OHY:[id number] DOB: 2001-01-09 DOA: 06/21/2020 PCP: Patient, No Pcp Per      Brief Narrative:  Brianna Lloyd is a 19 y.o. F with schizoaffective disorder who presented with epigastric pain.  Of note the patient is [redacted] weeks pregnant.  In the ER, lipase elevated, epigastric pain on exam.      Assessment & Plan:  Acute pancreatitis MRCP showed sludge without pancreatitis.  Her lipase is going up, her LFTs are improving.  There is no medication use prior to hospitalization that could contribute.  Denies alcohol.  Likely gallstone related.  Likely passed stone.  Will discuss with GI regarding feasibility of ERCP. -Trend lipase -Advance to clears -Trend LFTs   Hypokalemia Magnesium normal -Supplement potassium           Disposition: Status is: Observation  The patient will require care spanning > 2 midnights and should be moved to inpatient because: IV treatments appropriate due to intensity of illness or inability to take PO and Inpatient level of care appropriate due to severity of illness  Dispo: The patient is from: Home              Anticipated d/c is to: Home              Anticipated d/c date is: 2 days              Patient currently is not medically stable to d/c.     The patient was admitted with epigastric pain, vomiting, and elevated lipase.  Spine normal MRCP imaging of the pancreas, suspect this is pancreatitis, given her LFTs likely gallstone related.  She is already improving, and I am optimistic that by tomorrow, we will be able to transition her to solid diet and discharge         MDM: The below labs and imaging reports were reviewed and summarized above.  Medication management as above.    DVT prophylaxis:   Code Status: FULL Family Communication:            Subjective: The patient's epigastric pain is still present, but improved.  She has had no vomiting after clear  liquids today.  Objective: Vitals:   06/22/20 0532 06/22/20 0802 06/22/20 1135 06/22/20 1638  BP:  107/64 115/81 113/66  Pulse:  82 88   Resp:  18 18 18   Temp:  98.2 F (36.8 C) 98.9 F (37.2 C) 98.6 F (37 C)  TempSrc:  Oral Oral Oral  SpO2:  100% 100% 100%  Weight: 48.1 kg     Height:        Intake/Output Summary (Last 24 hours) at 06/22/2020 1933 Last data filed at 06/22/2020 1641 Gross per 24 hour  Intake 4381.01 ml  Output 1130 ml  Net 3251.01 ml   Filed Weights   06/21/20 1730 06/22/20 0532  Weight: 46 kg 48.1 kg    Examination: General appearance:  adult female, alert and in no obvious distress.   HEENT: Anicteric, conjunctiva pink, lids and lashes normal. No nasal deformity, discharge, epistaxis.  Lips moist.   Skin: Warm and dry.  No jaundice.  No suspicious rashes or lesions. Cardiac: RRR, nl S1-S2, no murmurs appreciated.  Capillary refill is brisk.  JVP normal.  No LE edema.  Radial  pulses 2+ and symmetric. Respiratory: Normal respiratory rate and rhythm.  CTAB without rales or wheezes. Abdomen: Abdomen soft.  Mild epigastric TTP voluntary guarding. No ascites, distension,  hepatosplenomegaly.   MSK: No deformities or effusions. Neuro: Awake and alert.  EOMI, moves all extremities. Speech fluent.    Psych: Sensorium intact and responding to questions, attention normal. Affect normal.  Judgment and insight appear normal.    Data Reviewed: I have personally reviewed following labs and imaging studies:  CBC: Recent Labs  Lab 06/21/20 1719  WBC 5.2  HGB 14.7  HCT 41.5  MCV 84.3  PLT 336   Basic Metabolic Panel: Recent Labs  Lab 06/21/20 1719 06/22/20 0859  NA 133* 132*  K 2.7* 3.3*  CL 93* 105  CO2 22 19*  GLUCOSE 111* 84  BUN 10 6  CREATININE 0.52 0.57  CALCIUM 10.0 8.2*  MG 1.9  --    GFR: Estimated Creatinine Clearance: 80.6 mL/min (by C-G formula based on SCr of 0.57 mg/dL). Liver Function Tests: Recent Labs  Lab 06/21/20 1719  06/22/20 0859  AST 130* 99*  ALT 297* 228*  ALKPHOS 58 45  BILITOT 1.9* 1.7*  PROT 8.6* 6.1*  ALBUMIN 4.6 3.4*   Recent Labs  Lab 06/21/20 1719 06/22/20 1352  LIPASE 585* 896*  AMYLASE 599*  --    No results for input(s): AMMONIA in the last 168 hours. Coagulation Profile: No results for input(s): INR, PROTIME in the last 168 hours. Cardiac Enzymes: No results for input(s): CKTOTAL, CKMB, CKMBINDEX, TROPONINI in the last 168 hours. BNP (last 3 results) No results for input(s): PROBNP in the last 8760 hours. HbA1C: No results for input(s): HGBA1C in the last 72 hours. CBG: No results for input(s): GLUCAP in the last 168 hours. Lipid Profile: No results for input(s): CHOL, HDL, LDLCALC, TRIG, CHOLHDL, LDLDIRECT in the last 72 hours. Thyroid Function Tests: No results for input(s): TSH, T4TOTAL, FREET4, T3FREE, THYROIDAB in the last 72 hours. Anemia Panel: No results for input(s): VITAMINB12, FOLATE, FERRITIN, TIBC, IRON, RETICCTPCT in the last 72 hours. Urine analysis:    Component Value Date/Time   COLORURINE YELLOW (A) 06/01/2020 0458   APPEARANCEUR CLOUDY (A) 06/01/2020 0458   LABSPEC 1.030 06/01/2020 0458   PHURINE 5.0 06/01/2020 0458   GLUCOSEU NEGATIVE 06/01/2020 0458   HGBUR LARGE (A) 06/01/2020 0458   BILIRUBINUR NEGATIVE 06/01/2020 0458   KETONESUR 80 (A) 06/01/2020 0458   PROTEINUR >=300 (A) 06/01/2020 0458   NITRITE NEGATIVE 06/01/2020 0458   LEUKOCYTESUR SMALL (A) 06/01/2020 0458   Sepsis Labs: @LABRCNTIP (procalcitonin:4,lacticacidven:4)  ) Recent Results (from the past 240 hour(s))  SARS Coronavirus 2 by RT PCR (hospital order, performed in [facility] hospital lab) Nasopharyngeal Nasopharyngeal Swab     Status: None   Collection Time: 06/21/20  5:53 PM   Specimen: Nasopharyngeal Swab  Result Value Ref Range Status   SARS Coronavirus 2 NEGATIVE NEGATIVE Final    Comment: (NOTE) SARS-CoV-2 target nucleic acids are NOT DETECTED.  The SARS-CoV-2  RNA is generally detectable in upper and lower respiratory specimens during the acute phase of infection. The lowest concentration of SARS-CoV-2 viral copies this assay can detect is 250 copies / mL. A negative result does not preclude SARS-CoV-2 infection and should not be used as the sole basis for treatment or other patient management decisions.  A negative result may occur with improper specimen collection / handling, submission of specimen other than nasopharyngeal swab, presence of viral mutation(s) within the areas targeted by this assay, and inadequate number of viral copies (<250 copies / mL). A negative result must be combined with clinical observations, patient history, and epidemiological information.  Fact  Sheet for Patients:   BoilerBrush.com.cy  Fact Sheet for Healthcare Providers: https://pope.com/  This test is not yet approved or  cleared by the [place] FDA and has been authorized for detection and/or diagnosis of SARS-CoV-2 by FDA under an Emergency Use Authorization (EUA).  This EUA will remain in effect (meaning this test can be used) for the duration of the COVID-19 declaration under Section 564(b)(1) of the Act, 21 U.S.C. section 360bbb-3(b)(1), unless the authorization is terminated or revoked sooner.  Performed at [facility], [address], [place], [place] [address]          Radiology Studies: US Abdomen Complete  Result Date: 06/21/2020 CLINICAL DATA:  Abdominal pain, elevated serum lipase, pregnant EXAM: ABDOMEN ULTRASOUND COMPLETE COMPARISON:  None. FINDINGS: Gallbladder: The gallbladder contains sludge, however, the gallbladder is not distended, there is no gallbladder wall thickening, and no pericholecystic fluid is identified. The sonographic Eulah Pont sign is reportedly negative. Common bile duct: Diameter: 3 mm in proximal diameter Liver: No focal lesion identified. Within normal  limits in parenchymal echogenicity. Portal vein is patent on color Doppler imaging with normal direction of blood flow towards the liver. IVC: No abnormality visualized. Pancreas: Visualized portion unremarkable. Spleen: Size and appearance within normal limits. Right Kidney: Length: 9.4 cm. Echogenicity within normal limits. No mass or hydronephrosis visualized. Left Kidney: Length: 9.7 cm. Echogenicity within normal limits. No mass or hydronephrosis visualized. Abdominal aorta: No aneurysm visualized. Other findings: None. IMPRESSION: 1. Gallbladder sludge without evidence of acute cholecystitis. 2. Otherwise unremarkable abdominal ultrasound. Electronically Signed   By: Helyn Numbers MD   On: 06/21/2020 21:19   MR ABDOMEN MRCP WO CONTRAST  Result Date: 06/22/2020 CLINICAL DATA:  Pregnant, hyperemesis gravidarum, pancreatitis EXAM: MRI ABDOMEN WITHOUT CONTRAST  (INCLUDING MRCP) TECHNIQUE: Multiplanar multisequence MR imaging of the abdomen was performed. Heavily T2-weighted images of the biliary and pancreatic ducts were obtained, and three-dimensional MRCP images were rendered by post processing. COMPARISON:  None. FINDINGS: Lower chest: Lung bases are clear. Hepatobiliary: Liver is within normal limits. Gallbladder is unremarkable. No intrahepatic or extrahepatic ductal dilatation. Common duct measures 4 mm. No choledocholithiasis is seen. Pancreas: Within normal limits. No peripancreatic fluid/inflammatory changes. Spleen:  Within normal limits. Adrenals/Urinary Tract:  Adrenal glands are within normal limits. Kidneys are within normal limits.  No hydronephrosis. Stomach/Bowel: Stomach is within normal limits. Visualized bowel is unremarkable. Vascular/Lymphatic:  No evidence of abdominal aortic aneurysm. No suspicious abdominal lymphadenopathy. Other:  No abdominal ascites. Musculoskeletal: No focal osseous lesions. IMPRESSION: Negative MRI abdomen. Specifically, pancreas is within normal limits. No  cholelithiasis or choledocholithiasis. Electronically Signed   By: Charline Bills M.D.   On: 06/22/2020 05:17        Scheduled Meds: . famotidine  20 mg Oral BID  . ondansetron  4 mg Oral Q6H   Continuous Infusions: . sodium chloride 200 mL/hr at 06/22/20 0227  . metronidazole 500 mg (06/22/20 0837)     LOS: 0 days    Time spent: 25 minutes    Alberteen Sam, MD [facility] 06/22/2020, 7:33 PM     Please page though AMION or Epic secure chat:  For [employer], Higher education careers adviser

## 2020-06-22 NOTE — Progress Notes (Signed)
ANTEPARTUM PROGRESS NOTE  Brianna Lloyd is a 19 y.o. G1P0 at [redacted]w[redacted]d with Estimated Date of Delivery: 01/17/21 who is admitted for hyperemesis gravidarum.  Length of Stay:  0 Days. Admitted 06/21/2020  Subjective: Pt reports improved nausea today, improved epigastric pain this morning and denies need for medication.  - She has tolerated PO- 1 can of ginger ale without vomiting  - She reports no uterine contractions, no bleeding and no loss of fluid per vagina. - Pt has completed Edinburgh depression scale, reports that she struggles to perform normal ADLs with her depression currently. Reports suicidal ideation without plan.   Vitals:  BP 107/64 (BP Location: Right Arm)   Pulse 82   Temp 98.2 F (36.8 C) (Oral)   Resp 18   Ht 5' (1.524 m)   Wt 48.1 kg   LMP 04/12/2020   SpO2 100%   BMI 20.71 kg/m   Physical Examination: CONSTITUTIONAL: Well-developed, well-nourished female in no acute distress.  HENT:  Normocephalic, atraumatic EYES: No scleral icterus.  NECK: Normal range of motion, supple, no masses SKIN: Skin is warm and dry. No rash noted. Not diaphoretic. No erythema. No pallor. NEUROLGIC: Alert and oriented to person, place, and time. Normal reflexes, muscle tone coordination. PSYCHIATRIC: Normal mood and affect. Normal behavior. Normal judgment and thought content. MUSCULOSKELETAL: Normal range of motion. No edema and no tenderness. 2+ distal pulses. ABDOMEN: Soft, nontender, nondistended; FHR via doppler 160bpm   Results for orders placed or performed during the hospital encounter of 06/21/20 (from the past 48 hour(s))  CBC on admission     Status: None   Collection Time: 06/21/20  5:19 PM  Result Value Ref Range   WBC 5.2 4.0 - 10.5 K/uL   RBC 4.92 3.87 - 5.11 MIL/uL   Hemoglobin 14.7 12.0 - 15.0 g/dL   HCT 32.2 36 - 46 %   MCV 84.3 80.0 - 100.0 fL   MCH 29.9 26.0 - 34.0 pg   MCHC 35.4 30.0 - 36.0 g/dL   RDW 02.5 42.7 - 06.2 %   Platelets 336 150 - 400 K/uL    nRBC 0.0 0.0 - 0.2 %    Comment: Performed at [facility], [address] [place], [place] [address]  Comprehensive metabolic panel     Status: Abnormal   Collection Time: 06/21/20  5:19 PM  Result Value Ref Range   Sodium 133 (L) 135 - 145 mmol/L   Potassium 2.7 (LL) 3.5 - 5.1 mmol/L    Comment: CRITICAL RESULT CALLED TO, READ BACK BY AND VERIFIED WITH MADDIE BUTLER AT 1750 06/21/20.PMF   Chloride 93 (L) 98 - 111 mmol/L   CO2 22 22 - 32 mmol/L   Glucose, Bld 111 (H) 70 - 99 mg/dL    Comment: Glucose reference range applies only to samples taken after fasting for at least 8 hours.   BUN 10 6 - 20 mg/dL   Creatinine, Ser 3.15 0.44 - 1.00 mg/dL   Calcium 17.6 8.9 - 16.0 mg/dL   Total Protein 8.6 (H) 6.5 - 8.1 g/dL   Albumin 4.6 3.5 - 5.0 g/dL   AST 737 (H) 15 - 41 U/L   ALT 297 (H) 0 - 44 U/L   Alkaline Phosphatase 58 38 - 126 U/L   Total Bilirubin 1.9 (H) 0.3 - 1.2 mg/dL   GFR calc non Af Amer >60 >60 mL/min   GFR calc Af Amer >60 >60 mL/min   Anion gap 18 (H) 5 - 15  Comment: Performed at [facility], [address] [place], [place] [address]  Lipase, blood     Status: Abnormal   Collection Time: 06/21/20  5:19 PM  Result Value Ref Range   Lipase 585 (H) 11 - 51 U/L    Comment: RESULT CONFIRMED BY MANUAL DILUTION.PMF Performed at [facility], [address] [place], [place] [address]   Amylase     Status: Abnormal   Collection Time: 06/21/20  5:19 PM  Result Value Ref Range   Amylase 599 (H) 28 - 100 U/L    Comment: Performed at [facility], [address] [place], [place] [address]  Magnesium     Status: None   Collection Time: 06/21/20  5:19 PM  Result Value Ref Range   Magnesium 1.9 1.7 - 2.4 mg/dL    Comment: Performed at [facility], [address] [place], [place] [address]  SARS Coronavirus 2 by RT PCR (hospital order, performed in [facility] hospital lab) Nasopharyngeal Nasopharyngeal Swab     Status: None    Collection Time: 06/21/20  5:53 PM   Specimen: Nasopharyngeal Swab  Result Value Ref Range   SARS Coronavirus 2 NEGATIVE NEGATIVE    Comment: (NOTE) SARS-CoV-2 target nucleic acids are NOT DETECTED.  The SARS-CoV-2 RNA is generally detectable in upper and lower respiratory specimens during the acute phase of infection. The lowest concentration of SARS-CoV-2 viral copies this assay can detect is 250 copies / mL. A negative result does not preclude SARS-CoV-2 infection and should not be used as the sole basis for treatment or other patient management decisions.  A negative result may occur with improper specimen collection / handling, submission of specimen other than nasopharyngeal swab, presence of viral mutation(s) within the areas targeted by this assay, and inadequate number of viral copies (<250 copies / mL). A negative result must be combined with clinical observations, patient history, and epidemiological information.  Fact Sheet for Patients:   BoilerBrush.com.cy  Fact Sheet for Healthcare Providers: https://pope.com/  This test is not yet approved or  cleared by the [place] FDA and has been authorized for detection and/or diagnosis of SARS-CoV-2 by FDA under an Emergency Use Authorization (EUA).  This EUA will remain in effect (meaning this test can be used) for the duration of the COVID-19 declaration under Section 564(b)(1) of the Act, 21 U.S.C. section 360bbb-3(b)(1), unless the authorization is terminated or revoked sooner.  Performed at [facility], [address] [place], [place] [address]   Comprehensive metabolic panel     Status: Abnormal   Collection Time: 06/22/20  8:59 AM  Result Value Ref Range   Sodium 132 (L) 135 - 145 mmol/L   Potassium 3.3 (L) 3.5 - 5.1 mmol/L   Chloride 105 98 - 111 mmol/L   CO2 19 (L) 22 - 32 mmol/L   Glucose, Bld 84 70 - 99 mg/dL    Comment: Glucose reference  range applies only to samples taken after fasting for at least 8 hours.   BUN 6 6 - 20 mg/dL   Creatinine, Ser 6.96 0.44 - 1.00 mg/dL   Calcium 8.2 (L) 8.9 - 10.3 mg/dL   Total Protein 6.1 (L) 6.5 - 8.1 g/dL   Albumin 3.4 (L) 3.5 - 5.0 g/dL   AST 99 (H) 15 - 41 U/L   ALT 228 (H) 0 - 44 U/L   Alkaline Phosphatase 45 38 - 126 U/L   Total Bilirubin 1.7 (H) 0.3 - 1.2  mg/dL   GFR calc non Af Amer >60 >60 mL/min   GFR calc Af Amer >60 >60 mL/min   Anion gap 8 5 - 15    Comment: Performed at [facility], [address] [place], [place] [address]    US Abdomen Complete  Result Date: 06/21/2020 CLINICAL DATA:  Abdominal pain, elevated serum lipase, pregnant EXAM: ABDOMEN ULTRASOUND COMPLETE COMPARISON:  None. FINDINGS: Gallbladder: The gallbladder contains sludge, however, the gallbladder is not distended, there is no gallbladder wall thickening, and no pericholecystic fluid is identified. The sonographic Eulah Pont sign is reportedly negative. Common bile duct: Diameter: 3 mm in proximal diameter Liver: No focal lesion identified. Within normal limits in parenchymal echogenicity. Portal vein is patent on color Doppler imaging with normal direction of blood flow towards the liver. IVC: No abnormality visualized. Pancreas: Visualized portion unremarkable. Spleen: Size and appearance within normal limits. Right Kidney: Length: 9.4 cm. Echogenicity within normal limits. No mass or hydronephrosis visualized. Left Kidney: Length: 9.7 cm. Echogenicity within normal limits. No mass or hydronephrosis visualized. Abdominal aorta: No aneurysm visualized. Other findings: None. IMPRESSION: 1. Gallbladder sludge without evidence of acute cholecystitis. 2. Otherwise unremarkable abdominal ultrasound. Electronically Signed   By: Helyn Numbers MD   On: 06/21/2020 21:19   MR ABDOMEN MRCP WO CONTRAST  Result Date: 06/22/2020 CLINICAL DATA:  Pregnant, hyperemesis gravidarum, pancreatitis EXAM: MRI ABDOMEN WITHOUT  CONTRAST  (INCLUDING MRCP) TECHNIQUE: Multiplanar multisequence MR imaging of the abdomen was performed. Heavily T2-weighted images of the biliary and pancreatic ducts were obtained, and three-dimensional MRCP images were rendered by post processing. COMPARISON:  None. FINDINGS: Lower chest: Lung bases are clear. Hepatobiliary: Liver is within normal limits. Gallbladder is unremarkable. No intrahepatic or extrahepatic ductal dilatation. Common duct measures 4 mm. No choledocholithiasis is seen. Pancreas: Within normal limits. No peripancreatic fluid/inflammatory changes. Spleen:  Within normal limits. Adrenals/Urinary Tract:  Adrenal glands are within normal limits. Kidneys are within normal limits.  No hydronephrosis. Stomach/Bowel: Stomach is within normal limits. Visualized bowel is unremarkable. Vascular/Lymphatic:  No evidence of abdominal aortic aneurysm. No suspicious abdominal lymphadenopathy. Other:  No abdominal ascites. Musculoskeletal: No focal osseous lesions. IMPRESSION: Negative MRI abdomen. Specifically, pancreas is within normal limits. No cholelithiasis or choledocholithiasis. Electronically Signed   By: Charline Bills M.D.   On: 06/22/2020 05:17    Current scheduled medications   I have reviewed the patient's current medications.  ASSESSMENT: Patient Active Problem List   Diagnosis Date Noted  . Hyperemesis complicating pregnancy, antepartum 06/21/2020  . Encounter for supervision of normal first pregnancy in first trimester 06/09/2020  . PTSD (post-traumatic stress disorder) 01/20/2016    PLAN:  Continue routine antenatal care. Improved CMP this am, pt has only voided once since arrival but now tolerating PO intake. Will transition to PO anti-emetics. Consult to inpatient psych, Pt with hx foster care, homelessness, recent death of intimate partner and "self treating" her depression. Pt reports having been on multiple medications in the past as a young teenager. Requesting  evaluation for possible medication mgmt of perinatal depression.     Randa Ngo, CNM 06/22/2020  10:06 AM

## 2020-06-22 NOTE — TOC Initial Note (Signed)
Transition of Care [facility]) - Initial/Assessment Note    Patient Details  Name: Brianna Lloyd MRN: [id number] Date of Birth: 2001-01-20  Transition of Care [facility] CM/SW Contact:    Larwance Rote, LCSW Phone Number: 06/22/2020, 5:54 PM  Clinical Narrative: The patient is a 19 year old female who presented to [facility] ED with nausea and vomiting.  Patient is [redacted] weeks pregnant/1st trimester.  Patient reported that she is living with a friend in [place].  Plans on moving into "a home for pregnant women."  State that she has a case worker from social services. She is also completed her GED.   Social worker discussed with patient resources for pregnant mother's needing shelter. 1.  [facility] [place], [place] [phone] 2.  [facility] [place]  Expected Discharge Plan: Home/Self Care Barriers to Discharge: Continued Medical Work up   Patient Goals and CMS Choice Patient states their goals for this hospitalization and ongoing recovery are:: Patient would like assisstance with housing for pregnant women      Expected Discharge Plan and Services Expected Discharge Plan: Home/Self Care In-house Referral: Clinical Social Work Discharge Planning Services: Other - See comment ([facility] pregnant women [phone])   Living arrangements for the past 2 months: Apartment (Unstable housing.  Living with a friend.)                   Prior Living Arrangements/Services Living arrangements for the past 2 months: Apartment (Unstable housing.  Living with a friend.) Lives with:: Friends Patient language and need for interpreter reviewed:: No Do you feel safe going back to the place where you live?: Yes      Need for Family Participation in Patient Care: No (Comment) Care giver support system in place?: No (comment)   Criminal Activity/Legal Involvement Pertinent to Current Situation/Hospitalization: No - Comment as needed  Activities of Daily Living Home  Assistive Devices/Equipment: None ADL Screening (condition at time of admission) Patient's cognitive ability adequate to safely complete daily activities?: Yes Is the patient deaf or have difficulty hearing?: No Does the patient have difficulty seeing, even when wearing glasses/contacts?: No Does the patient have difficulty concentrating, remembering, or making decisions?: No Patient able to express need for assistance with ADLs?: Yes Does the patient have difficulty dressing or bathing?: No Independently performs ADLs?: Yes (appropriate for developmental age) Does the patient have difficulty walking or climbing stairs?: No Weakness of Legs: None Weakness of Arms/Hands: None  Permission Sought/Granted     Emotional Assessment Appearance:: Appears stated age Attitude/Demeanor/Rapport: Engaged Affect (typically observed): Accepting Orientation: : Oriented to Self, Oriented to Place, Oriented to  Time, Oriented to Situation Alcohol / Substance Use: Not Applicable Psych Involvement: Yes (comment)  Admission diagnosis:  Hyperemesis complicating pregnancy, antepartum [O21.0] Patient Active Problem List   Diagnosis Date Noted  . Hyperemesis complicating pregnancy, antepartum 06/21/2020  . Encounter for supervision of normal first pregnancy in first trimester 06/09/2020  . PTSD (post-traumatic stress disorder) 01/20/2016   PCP:  Patient, No Pcp Per Pharmacy:   [facility] [phone] Dan Humphre[place], [place] - [address] [place] [address] Phone: [phone] Fax: [phone]  Social Determinants of Health (SDOH) Interventions   Readmission Risk Interventions No flowsheet data found.

## 2020-06-22 NOTE — Social Work (Signed)
TOC CM/SW received request for social work assessment of needs.   Social worker is following this patient.

## 2020-06-23 LAB — COMPREHENSIVE METABOLIC PANEL
ALT: 161 U/L — ABNORMAL HIGH (ref 0–44)
AST: 45 U/L — ABNORMAL HIGH (ref 15–41)
Albumin: 3.3 g/dL — ABNORMAL LOW (ref 3.5–5.0)
Alkaline Phosphatase: 42 U/L (ref 38–126)
Anion gap: 7 (ref 5–15)
BUN: <5 mg/dL — ABNORMAL LOW (ref 6–20)
CO2: 22 mmol/L (ref 22–32)
Calcium: 8.6 mg/dL — ABNORMAL LOW (ref 8.9–10.3)
Chloride: 107 mmol/L (ref 98–111)
Creatinine, Ser: 0.46 mg/dL (ref 0.44–1.00)
GFR calc Af Amer: >60 mL/min (ref >60)
GFR calc non Af Amer: >60 mL/min (ref >60)
Glucose, Bld: 88 mg/dL (ref 70–99)
Potassium: 3.3 mmol/L — ABNORMAL LOW (ref 3.5–5.1)
Sodium: 136 mmol/L (ref 135–145)
Total Bilirubin: 0.7 mg/dL (ref 0.3–1.2)
Total Protein: 5.6 g/dL — ABNORMAL LOW (ref 6.5–8.1)

## 2020-06-23 LAB — CBC
HCT: 29.7 % — ABNORMAL LOW (ref 36.0–46.0)
Hemoglobin: 10.2 g/dL — ABNORMAL LOW (ref 12.0–15.0)
MCH: 30 pg (ref 26.0–34.0)
MCHC: 34.3 g/dL (ref 30.0–36.0)
MCV: 87.4 fL (ref 80.0–100.0)
Platelets: 212 10*3/uL (ref 150–400)
RBC: 3.4 MIL/uL — ABNORMAL LOW (ref 3.87–5.11)
RDW: 12.6 % (ref 11.5–15.5)
WBC: 4.8 10*3/uL (ref 4.0–10.5)
nRBC: 0 % (ref 0.0–0.2)

## 2020-06-23 LAB — LIPASE, BLOOD: Lipase: 608 U/L — ABNORMAL HIGH (ref 11–51)

## 2020-06-23 MED ORDER — DOCUSATE SODIUM 100 MG PO CAPS: 100.0000 mg | ORAL_CAPSULE | Freq: Two times a day (BID) | ORAL | Status: DC | PRN

## 2020-06-23 MED ORDER — TRIAMCINOLONE ACETONIDE 0.1 % EX OINT: TOPICAL_OINTMENT | Freq: Two times a day (BID) | CUTANEOUS | 0 refills | Status: DC

## 2020-06-23 MED ORDER — FAMOTIDINE 20 MG PO TABS: 20.0000 mg | ORAL_TABLET | Freq: Two times a day (BID) | ORAL | 0 refills | Status: DC

## 2020-06-23 MED ORDER — TRIAMCINOLONE ACETONIDE 0.1 % EX OINT
TOPICAL_OINTMENT | Freq: Two times a day (BID) | CUTANEOUS | Status: DC
  Filled 2020-06-23: qty 15

## 2020-06-23 MED ORDER — MELATONIN 5 MG PO TABS: 5.0000 mg | ORAL_TABLET | Freq: Every evening | ORAL | 0 refills | Status: DC | PRN

## 2020-06-23 MED ORDER — METRONIDAZOLE 500 MG PO TABS
2000.0000 mg | ORAL_TABLET | Freq: Once | ORAL | Status: AC
  Administered 2020-06-23: 2000 mg via ORAL
  Filled 2020-06-23: qty 4

## 2020-06-23 MED ORDER — SERTRALINE HCL 25 MG PO TABS: 25.0000 mg | ORAL_TABLET | Freq: Every day | ORAL | 1 refills | Status: DC

## 2020-06-23 MED ORDER — ONDANSETRON 4 MG PO TBDP: 4.0000 mg | ORAL_TABLET | Freq: Four times a day (QID) | ORAL | 1 refills | Status: DC

## 2020-06-23 MED ORDER — PROMETHAZINE HCL 12.5 MG PO TABS
12.5000 mg | ORAL_TABLET | ORAL | 0 refills | Status: DC | PRN
Start: 2020-06-23 — End: 2022-01-08

## 2020-06-23 MED ORDER — DOCUSATE SODIUM 100 MG PO CAPS: 100.0000 mg | ORAL_CAPSULE | Freq: Two times a day (BID) | ORAL | 0 refills | Status: DC | PRN

## 2020-06-23 MED ORDER — POTASSIUM CHLORIDE CRYS ER 20 MEQ PO TBCR
40.0000 meq | EXTENDED_RELEASE_TABLET | Freq: Three times a day (TID) | ORAL | Status: DC
  Administered 2020-06-23: 40 meq via ORAL
  Filled 2020-06-23: qty 2

## 2020-06-23 MED ORDER — LORATADINE 10 MG PO TABS
10.0000 mg | ORAL_TABLET | Freq: Every day | ORAL | Status: DC
  Administered 2020-06-23: 10 mg via ORAL
  Filled 2020-06-23: qty 1

## 2020-06-23 MED ORDER — ACETAMINOPHEN 325 MG PO TABS: 650.0000 mg | ORAL_TABLET | Freq: Four times a day (QID) | ORAL | 0 refills | Status: DC | PRN

## 2020-06-23 NOTE — Discharge Summary (Signed)
Patient ID: Brianna Lloyd MRN: [id number] DOB/AGE: [birth year] 19 y.o.  Admit date: 06/21/2020 Discharge date: 06/23/2020  Admission Diagnoses: Hyperemesis gravidarum, pancreatitis, [redacted]weeks gestation  Discharge Diagnoses: Hyperemesis gravidarum, pancreatitis, [redacted]weeks gestation; elevated transaminase, hypokalemia.  Prenatal Procedures: none  Consults: Hospitalist, inpatient Psych and social work.   Significant Diagnostic Studies:  Results for orders placed or performed during the hospital encounter of 06/21/20 (from the past 168 hour(s))  CBC on admission   Collection Time: 06/21/20  5:19 PM  Result Value Ref Range   WBC 5.2 4.0 - 10.5 K/uL   RBC 4.92 3.87 - 5.11 MIL/uL   Hemoglobin 14.7 12.0 - 15.0 g/dL   HCT 41.5 36 - 46 %   MCV 84.3 80.0 - 100.0 fL   MCH 29.9 26.0 - 34.0 pg   MCHC 35.4 30.0 - 36.0 g/dL   RDW 11.9 11.5 - 15.5 %   Platelets 336 150 - 400 K/uL   nRBC 0.0 0.0 - 0.2 %  Comprehensive metabolic panel   Collection Time: 06/21/20  5:19 PM  Result Value Ref Range   Sodium 133 (L) 135 - 145 mmol/L   Potassium 2.7 (LL) 3.5 - 5.1 mmol/L   Chloride 93 (L) 98 - 111 mmol/L   CO2 22 22 - 32 mmol/L   Glucose, Bld 111 (H) 70 - 99 mg/dL   BUN 10 6 - 20 mg/dL   Creatinine, Ser 0.52 0.44 - 1.00 mg/dL   Calcium 10.0 8.9 - 10.3 mg/dL   Total Protein 8.6 (H) 6.5 - 8.1 g/dL   Albumin 4.6 3.5 - 5.0 g/dL   AST 130 (H) 15 - 41 U/L   ALT 297 (H) 0 - 44 U/L   Alkaline Phosphatase 58 38 - 126 U/L   Total Bilirubin 1.9 (H) 0.3 - 1.2 mg/dL   GFR calc non Af Amer >60 >60 mL/min   GFR calc Af Amer >60 >60 mL/min   Anion gap 18 (H) 5 - 15  Lipase, blood   Collection Time: 06/21/20  5:19 PM  Result Value Ref Range   Lipase 585 (H) 11 - 51 U/L  Amylase   Collection Time: 06/21/20  5:19 PM  Result Value Ref Range   Amylase 599 (H) 28 - 100 U/L  Magnesium   Collection Time: 06/21/20  5:19 PM  Result Value Ref Range   Magnesium 1.9 1.7 - 2.4 mg/dL  SARS Coronavirus 2 by RT PCR  (hospital order, performed in [facility] hospital lab) Nasopharyngeal Nasopharyngeal Swab   Collection Time: 06/21/20  5:53 PM   Specimen: Nasopharyngeal Swab  Result Value Ref Range   SARS Coronavirus 2 NEGATIVE NEGATIVE  Hepatitis panel, acute   Collection Time: 06/21/20  9:14 PM  Result Value Ref Range   Hepatitis B Surface Ag NON REACTIVE NON REACTIVE   HCV Ab NON REACTIVE NON REACTIVE   Hep A IgM NON REACTIVE NON REACTIVE   Hep B C IgM NON REACTIVE NON REACTIVE  Comprehensive metabolic panel   Collection Time: 06/22/20  8:59 AM  Result Value Ref Range   Sodium 132 (L) 135 - 145 mmol/L   Potassium 3.3 (L) 3.5 - 5.1 mmol/L   Chloride 105 98 - 111 mmol/L   CO2 19 (L) 22 - 32 mmol/L   Glucose, Bld 84 70 - 99 mg/dL   BUN 6 6 - 20 mg/dL   Creatinine, Ser 0.57 0.44 - 1.00 mg/dL   Calcium 8.2 (L) 8.9 - 10.3 mg/dL   Total Protein 6.1 (  L) 6.5 - 8.1 g/dL   Albumin 3.4 (L) 3.5 - 5.0 g/dL   AST 99 (H) 15 - 41 U/L   ALT 228 (H) 0 - 44 U/L   Alkaline Phosphatase 45 38 - 126 U/L   Total Bilirubin 1.7 (H) 0.3 - 1.2 mg/dL   GFR calc non Af Amer >60 >60 mL/min   GFR calc Af Amer >60 >60 mL/min   Anion gap 8 5 - 15  Lipase, blood   Collection Time: 06/22/20  1:52 PM  Result Value Ref Range   Lipase 896 (H) 11 - 51 U/L  Comprehensive metabolic panel   Collection Time: 06/23/20  6:12 AM  Result Value Ref Range   Sodium 136 135 - 145 mmol/L   Potassium 3.3 (L) 3.5 - 5.1 mmol/L   Chloride 107 98 - 111 mmol/L   CO2 22 22 - 32 mmol/L   Glucose, Bld 88 70 - 99 mg/dL   BUN <5 (L) 6 - 20 mg/dL   Creatinine, Ser 0.46 0.44 - 1.00 mg/dL   Calcium 8.6 (L) 8.9 - 10.3 mg/dL   Total Protein 5.6 (L) 6.5 - 8.1 g/dL   Albumin 3.3 (L) 3.5 - 5.0 g/dL   AST 45 (H) 15 - 41 U/L   ALT 161 (H) 0 - 44 U/L   Alkaline Phosphatase 42 38 - 126 U/L   Total Bilirubin 0.7 0.3 - 1.2 mg/dL   GFR calc non Af Amer >60 >60 mL/min   GFR calc Af Amer >60 >60 mL/min   Anion gap 7 5 - 15  Lipase, blood   Collection  Time: 06/23/20  6:12 AM  Result Value Ref Range   Lipase 608 (H) 11 - 51 U/L  CBC   Collection Time: 06/23/20  6:12 AM  Result Value Ref Range   WBC 4.8 4.0 - 10.5 K/uL   RBC 3.40 (L) 3.87 - 5.11 MIL/uL   Hemoglobin 10.2 (L) 12.0 - 15.0 g/dL   HCT 29.7 (L) 36 - 46 %   MCV 87.4 80.0 - 100.0 fL   MCH 30.0 26.0 - 34.0 pg   MCHC 34.3 30.0 - 36.0 g/dL   RDW 12.6 11.5 - 15.5 %   Platelets 212 150 - 400 K/uL   nRBC 0.0 0.0 - 0.2 %    Treatments: IV hydration, antibiotics: metronidazole; antiemetics  Hospital Course:  This is a 19 y.o. G1P0 with IUP at 11w2dadmitted for HG,  noted to have elevated lipase, amylase and elevated LFTs, hypokalemia. Pt was given IV hydration, IV and PO potassium replacement. Pt had previously seen in ER and dx with trichomonal vaginitis, and was unable to tolerate PO Flagyl; IV Flagyl was given x 3 doses, then PO today on day of discharge. Diet was advanced from NPO to clear to bland regular and she was able to tolerate diet without difficulty. PO antiemetics: Zofran and Pepcid were given as scheduled doses and pt tolerated this well with good control of her nausea. She has not needed narcotics for epigastric pain, and pain was managed with occasional acetaminophen. Hospitalist team recommended outpatient f/u with general surgery for cholecystectomy due to imaging showing gallbladder sludge. She was deemed medically stable for discharge to home with outpatient follow up. Social work was requested for discharge planning for pt due to unstable living arrangements, and psych consult due to suicidal ideation without plan. Pt contracts for safety, Zoloft 254mPO started today. [facility] 07[facility]02/[facility].  Discharge  Physical Exam:  BP (!) 104/59 (BP Location: Left Arm)   Pulse 93   Temp 98.5 F (36.9 C) (Oral)   Resp 18   Ht 5' (1.524 m)   Wt 49 kg   LMP 04/12/2020   SpO2 100%   BMI 21.09 kg/m   General: NAD CV: RRR Pulm: CTABL, nl effort ABD:  s/nd/nt, gravid DVT Evaluation: LE non-ttp, no evidence of DVT on exam. FHR via doppler this morning: 170bpm   Discharge Condition: Stable  Disposition: Discharge disposition: 01-Home or Self Care     home   Allergies as of 06/23/2020   No Known Allergies     Medication List    STOP taking these medications   Doxylamine-Pyridoxine 10-10 MG Tbec   Halobetasol-Ammonium Lactate 0.05 & 12 % (Cream) Kit   metoCLOPramide 10 MG tablet Commonly known as: REGLAN   tretinoin 0.025 % cream Commonly known as: RETIN-A   triamcinolone cream 0.1 % Commonly known as: KENALOG Replaced by: triamcinolone ointment 0.1 %   Vitamin D3 50 MCG (2000 UT) capsule     TAKE these medications   acetaminophen 325 MG tablet Commonly known as: TYLENOL Take 2 tablets (650 mg total) by mouth every 6 (six) hours as needed (for pain scale < 4  OR  temperature  >/=  100.5 F).   cetirizine 10 MG tablet Commonly known as: ZYRTEC Take 10 mg by mouth daily.   docusate sodium 100 MG capsule Commonly known as: COLACE Take 1 capsule (100 mg total) by mouth 2 (two) times daily as needed for mild constipation.   famotidine 20 MG tablet Commonly known as: PEPCID Take 1 tablet (20 mg total) by mouth 2 (two) times daily.   melatonin 5 MG Tabs Take 1 tablet (5 mg total) by mouth at bedtime as needed (sleep).   ondansetron 4 MG disintegrating tablet Commonly known as: ZOFRAN-ODT Take 1 tablet (4 mg total) by mouth every 6 (six) hours.   prenatal multivitamin Tabs tablet Take 1 tablet by mouth daily at 12 noon.   promethazine 12.5 MG tablet Commonly known as: PHENERGAN Take 1-2 tablets (12.5-25 mg total) by mouth every 4 (four) hours as needed for nausea or vomiting.   sertraline 25 MG tablet Commonly known as: Zoloft Take 1 tablet (25 mg total) by mouth daily.   triamcinolone ointment 0.1 % Commonly known as: KENALOG Apply topically 2 (two) times daily. Replaces: triamcinolone cream 0.1 %        Follow-up Information    Ronny Bacon, MD. Schedule an appointment as soon as possible for a visit.   Specialty: General Surgery Why: follow-up to discuss need of surgery during pregnancy or after for possible gallstone(s) Contact information: [address] [address] [phone]        [facility] Follow up.   Why: new OB visit  Contact information: [place] [phone]              Signed:  Francetta Found, CNM 06/23/2020  3:51 PM

## 2020-06-23 NOTE — Progress Notes (Signed)
Patient discharged home with friend. Discharge instructions and prescriptions given and reviewed with patient. Patient verbalized understanding. Will be escorted out by auxillary.

## 2020-06-23 NOTE — Progress Notes (Addendum)
C/O Nausea at 2030 and received scheduled Zofran with relief until c/o Nausea t 2319. Pt. Was given PRN Phenergan at 2319.  At that time,Pt. Also stated she was "aggravated' because she wanted to eat a "Cheesburger." Pt. Has appeared to rest well and in NAD. She has showered and ambulates with steady gait. She has tolerated clear liquids. She has not had any vaginal bleeding and/or discharge. Weight is 48.9 Kg. this A.M.; this is increased from yesterdays weight of 48.1 kg.

## 2020-06-23 NOTE — TOC Progression Note (Signed)
Transition of Care [facility]) - Progression Note    Patient Details  Name: Brianna Lloyd MRN: [id number] Date of Birth: [birth year]  [facility]) CM/SW Contact  Marika Mahaffy, Johnnette Litter, [place] Phone Number: 06/23/2020, 3:24 PM  Clinical Narrative:     [facility] Care Manager spoke to pt. Provided resources for mental health, support groups and maternity housing in [place], [place] and [place] counties. Pt. Ask for journal. Provided her with a journal. Discussed calling her current community social worker for more housing resources. Pt. States she will call on Tuesday. States she can return to current place of residence post discharge until she can make other arrangements.   Expected Discharge Plan: Home/Self Care Barriers to Discharge: Continued Medical Work up  Expected Discharge Plan and Services Expected Discharge Plan: Home/Self Care In-house Referral: Clinical Social Work Discharge Planning Services: Other - See comment ([facility] for pregnant women [phone])   Living arrangements for the past 2 months: Apartment (Unstable housing.  Living with a friend.)                                       Social Determinants of Health (SDOH) Interventions    Readmission Risk Interventions No flowsheet data found.

## 2020-06-23 NOTE — Progress Notes (Signed)
Pt ate 1 pancake, 1/2 boiled egg, grits and 400 ml juice.  No c/o nausea.  Provided bland diet handout for food options.  Pt set up for shower.

## 2020-06-23 NOTE — Consult Note (Signed)
[facility] Face-to-Face Psychiatry Consult   Reason for Consult:  Depression during pregnancy need for meds and referral  Referring Physician:   Patient Identification: Erykah Lippert MRN:  [id number] Principal Diagnosis:    Major Depression severe recurrent Generalized anxiety  Adjustment disorder  Reactive attachment issues     Diagnosis:  Active Problems:   Hyperemesis complicating pregnancy, antepartum  Same   Total Time spent with patient:  30-40 minutes   Subjective:   Daniel Johndrow is a 19 y.o. female patient admitted with hyperemesis grav.  Now improving with inpatient meds.  Being discharged ----today ----She requests psych and Social work to help her get Psych med clinic and support for her upcoming pregnancy and delivery     HPI:  Patient has a long history of major depression, dysthymia and bipolar disorder along with reactive attachment problems   Has been in multiple foster care homes over the years, now is settling in with female roommate (who is with her today) but requests services for further help   She has been on multiple meds since age [age] for depression and bipolar disorder and has had multiple past admits  Currently feels relatively stable but has   Return of depressed mood crying spells hopeless helpless feelings, lack of energy, motivation, enthusiasm, sleep, lack of concentration and attention .     No active SI HI or plans  She denies frank manic symptoms   She does have anxiety with anxious mood, nervousness tension, frustration, fear dread doom and gloom panic like symptoms as well  She has reactive attachment problems ---with no solid sense of self, chronic emptiness boredom and impulsivity at times, identity problems which is tied in with borderline personality issues where she has has suicide attempts and oD's before in her earlier years  To her favor she is finishing HS and is needing more stable life with child and all.   Seeks support and  outpatient med mgt                Past Psychiatric History:  Multiple admits in past last one one year ago for similar issues   Risk to Self:  low  Risk to Others:  none  Prior Inpatient Therapy:  multiple  Prior Outpatient Therapy:  none regular recently   Past Medical History:  Past Medical History:  Diagnosis Date  . Aggressive behavior of adolescent     Past Surgical History:  Procedure Laterality Date  . NO PAST SURGERIES     Family History:  Family History  Problem Relation Age of Onset  . Schizophrenia Mother   . Alcohol abuse Mother   . Schizophrenia Father   . Alcohol abuse Father    Family Psychiatric  History:  Already noted  Social History: see above  Social History   Substance and Sexual Activity  Alcohol Use Yes     Social History   Substance and Sexual Activity  Drug Use Yes  . Types: Marijuana    Social History   Socioeconomic History  . Marital status: Single    Spouse name: Not on file  . Number of children: Not on file  . Years of education: Not on file  . Highest education level: Not on file  Occupational History  . Not on file  Tobacco Use  . Smoking status: Never Smoker  . Smokeless tobacco: Never Used  Vaping Use  . Vaping Use: Every day  Substance and Sexual Activity  . Alcohol use: Yes  .  Drug use: Yes    Types: Marijuana  . Sexual activity: Not on file  Other Topics Concern  . Not on file  Social History Narrative  . Not on file   Social Determinants of Health   Financial Resource Strain:   . Difficulty of Paying Living Expenses: Not on file  Food Insecurity:   . Worried About Programme researcher, broadcasting/film/video in the Last Year: Not on file  . Ran Out of Food in the Last Year: Not on file  Transportation Needs:   . Lack of Transportation (Medical): Not on file  . Lack of Transportation (Non-Medical): Not on file  Physical Activity:   . Days of Exercise per Week: Not on file  . Minutes of Exercise per Session: Not on  file  Stress:   . Feeling of Stress : Not on file  Social Connections:   . Frequency of Communication with Friends and Family: Not on file  . Frequency of Social Gatherings with Friends and Family: Not on file  . Attends Religious Services: Not on file  . Active Member of Clubs or Organizations: Not on file  . Attends Banker Meetings: Not on file  . Marital Status: Not on file   Additional Social History:    Allergies:  No Known Allergies  Labs:  Results for orders placed or performed during the hospital encounter of 06/21/20 (from the past 48 hour(s))  CBC on admission     Status: None   Collection Time: 06/21/20  5:19 PM  Result Value Ref Range   WBC 5.2 4.0 - 10.5 K/uL   RBC 4.92 3.87 - 5.11 MIL/uL   Hemoglobin 14.7 12.0 - 15.0 g/dL   HCT 82.4 36 - 46 %   MCV 84.3 80.0 - 100.0 fL   MCH 29.9 26.0 - 34.0 pg   MCHC 35.4 30.0 - 36.0 g/dL   RDW 23.5 36.1 - 44.3 %   Platelets 336 150 - 400 K/uL   nRBC 0.0 0.0 - 0.2 %    Comment: Performed at [facility], [address], [place], [place] [address]  Comprehensive metabolic panel     Status: Abnormal   Collection Time: 06/21/20  5:19 PM  Result Value Ref Range   Sodium 133 (L) 135 - 145 mmol/L   Potassium 2.7 (LL) 3.5 - 5.1 mmol/L    Comment: CRITICAL RESULT CALLED TO, READ BACK BY AND VERIFIED WITH MADDIE BUTLER AT 1750 06/21/20.PMF   Chloride 93 (L) 98 - 111 mmol/L   CO2 22 22 - 32 mmol/L   Glucose, Bld 111 (H) 70 - 99 mg/dL    Comment: Glucose reference range applies only to samples taken after fasting for at least 8 hours.   BUN 10 6 - 20 mg/dL   Creatinine, Ser 8.67 0.44 - 1.00 mg/dL   Calcium 61.9 8.9 - 50.9 mg/dL   Total Protein 8.6 (H) 6.5 - 8.1 g/dL   Albumin 4.6 3.5 - 5.0 g/dL   AST 326 (H) 15 - 41 U/L   ALT 297 (H) 0 - 44 U/L   Alkaline Phosphatase 58 38 - 126 U/L   Total Bilirubin 1.9 (H) 0.3 - 1.2 mg/dL   GFR calc non Af Amer >60 >60 mL/min   GFR calc Af Amer >60 >60 mL/min   Anion  gap 18 (H) 5 - 15    Comment: Performed at [facility], [address] [place], [place] [address]  Lipase, blood  Status: Abnormal   Collection Time: 06/21/20  5:19 PM  Result Value Ref Range   Lipase 585 (H) 11 - 51 U/L    Comment: RESULT CONFIRMED BY MANUAL DILUTION.PMF Performed at [facility], [address] [place], [place] [address]   Amylase     Status: Abnormal   Collection Time: 06/21/20  5:19 PM  Result Value Ref Range   Amylase 599 (H) 28 - 100 U/L    Comment: Performed at [facility], [address] [place], [place] [address]  Magnesium     Status: None   Collection Time: 06/21/20  5:19 PM  Result Value Ref Range   Magnesium 1.9 1.7 - 2.4 mg/dL    Comment: Performed at [facility], [address] [place], [place] [address]  SARS Coronavirus 2 by RT PCR (hospital order, performed in [facility] hospital lab) Nasopharyngeal Nasopharyngeal Swab     Status: None   Collection Time: 06/21/20  5:53 PM   Specimen: Nasopharyngeal Swab  Result Value Ref Range   SARS Coronavirus 2 NEGATIVE NEGATIVE    Comment: (NOTE) SARS-CoV-2 target nucleic acids are NOT DETECTED.  The SARS-CoV-2 RNA is generally detectable in upper and lower respiratory specimens during the acute phase of infection. The lowest concentration of SARS-CoV-2 viral copies this assay can detect is 250 copies / mL. A negative result does not preclude SARS-CoV-2 infection and should not be used as the sole basis for treatment or other patient management decisions.  A negative result may occur with improper specimen collection / handling, submission of specimen other than nasopharyngeal swab, presence of viral mutation(s) within the areas targeted by this assay, and inadequate number of viral copies (<250 copies / mL). A negative result must be combined with clinical observations, patient history, and epidemiological information.  Fact Sheet for Patients:    BoilerBrush.com.cyhttps://www.fda.gov/media/136312/download  Fact Sheet for Healthcare Providers: https://pope.com/https://www.fda.gov/media/136313/download  This test is not yet approved or  cleared by the [place] FDA and has been authorized for detection and/or diagnosis of SARS-CoV-2 by FDA under an Emergency Use Authorization (EUA).  This EUA will remain in effect (meaning this test can be used) for the duration of the COVID-19 declaration under Section 564(b)(1) of the Act, 21 U.S.C. section 360bbb-3(b)(1), unless the authorization is terminated or revoked sooner.  Performed at [facility], [address] [place], [place] [address]   Hepatitis panel, acute     Status: None   Collection Time: 06/21/20  9:14 PM  Result Value Ref Range   Hepatitis B Surface Ag NON REACTIVE NON REACTIVE   HCV Ab NON REACTIVE NON REACTIVE    Comment: (NOTE) Nonreactive HCV antibody screen is consistent with no HCV infections,  unless recent infection is suspected or other evidence exists to indicate HCV infection.     Hep A IgM NON REACTIVE NON REACTIVE   Hep B C IgM NON REACTIVE NON REACTIVE    Comment: Performed at [facility], [address] [place], [place] [address]  Comprehensive metabolic panel     Status: Abnormal   Collection Time: 06/22/20  8:59 AM  Result Value Ref Range   Sodium 132 (L) 135 - 145 mmol/L   Potassium 3.3 (L) 3.5 - 5.1 mmol/L   Chloride 105 98 - 111 mmol/L   CO2 19 (L) 22 - 32 mmol/L   Glucose, Bld 84 70 - 99 mg/dL    Comment: Glucose reference range applies only to samples taken after  fasting for at least 8 hours.   BUN 6 6 - 20 mg/dL   Creatinine, Ser 2.40 0.44 - 1.00 mg/dL   Calcium 8.2 (L) 8.9 - 10.3 mg/dL   Total Protein 6.1 (L) 6.5 - 8.1 g/dL   Albumin 3.4 (L) 3.5 - 5.0 g/dL   AST 99 (H) 15 - 41 U/L   ALT 228 (H) 0 - 44 U/L   Alkaline Phosphatase 45 38 - 126 U/L   Total Bilirubin 1.7 (H) 0.3 - 1.2 mg/dL   GFR calc non Af Amer >60 >60 mL/min   GFR calc Af Amer  >60 >60 mL/min   Anion gap 8 5 - 15    Comment: Performed at [facility], [address] [place], [place] [address]  Lipase, blood     Status: Abnormal   Collection Time: 06/22/20  1:52 PM  Result Value Ref Range   Lipase 896 (H) 11 - 51 U/L    Comment: RESULT CONFIRMED BY MANUAL DILUTION.PMF Performed at [facility], [address] [place], [place] [address]   Comprehensive metabolic panel     Status: Abnormal   Collection Time: 06/23/20  6:12 AM  Result Value Ref Range   Sodium 136 135 - 145 mmol/L   Potassium 3.3 (L) 3.5 - 5.1 mmol/L   Chloride 107 98 - 111 mmol/L   CO2 22 22 - 32 mmol/L   Glucose, Bld 88 70 - 99 mg/dL    Comment: Glucose reference range applies only to samples taken after fasting for at least 8 hours.   BUN <5 (L) 6 - 20 mg/dL   Creatinine, Ser 2.68 0.44 - 1.00 mg/dL   Calcium 8.6 (L) 8.9 - 10.3 mg/dL   Total Protein 5.6 (L) 6.5 - 8.1 g/dL   Albumin 3.3 (L) 3.5 - 5.0 g/dL   AST 45 (H) 15 - 41 U/L   ALT 161 (H) 0 - 44 U/L   Alkaline Phosphatase 42 38 - 126 U/L   Total Bilirubin 0.7 0.3 - 1.2 mg/dL   GFR calc non Af Amer >60 >60 mL/min   GFR calc Af Amer >60 >60 mL/min   Anion gap 7 5 - 15    Comment: Performed at [facility], [address] [place], [place] [address]  Lipase, blood     Status: Abnormal   Collection Time: 06/23/20  6:12 AM  Result Value Ref Range   Lipase 608 (H) 11 - 51 U/L    Comment: RESULT CONFIRMED BY MANUAL DILUTION SNG Performed at [facility], [address] [place], [place] [address]   CBC     Status: Abnormal   Collection Time: 06/23/20  6:12 AM  Result Value Ref Range   WBC 4.8 4.0 - 10.5 K/uL   RBC 3.40 (L) 3.87 - 5.11 MIL/uL   Hemoglobin 10.2 (L) 12.0 - 15.0 g/dL    Comment: REPEATED TO VERIFY   HCT 29.7 (L) 36 - 46 %   MCV 87.4 80.0 - 100.0 fL   MCH 30.0 26.0 - 34.0 pg   MCHC 34.3 30.0 - 36.0 g/dL   RDW 98.9 21.1 - 94.1 %   Platelets 212 150 - 400 K/uL   nRBC 0.0 0.0 -  0.2 %    Comment: Performed at [facility], [address], [place], [place] [address]    Current Facility-Administered Medications  Medication Dose Route Frequency Provider Last Rate Last Admin  . 0.9 %  sodium chloride infusion  Intravenous PRN Gustavo Lah, CNM 10 mL/hr at 06/23/20 0856 500 mL at 06/23/20 0856  . acetaminophen (TYLENOL) tablet 650 mg  650 mg Oral Q4H PRN Gustavo Lah, CNM   650 mg at 06/23/20 1353  . calcium carbonate (TUMS - dosed in mg elemental calcium) chewable tablet 400 mg of elemental calcium  2 tablet Oral Q4H PRN Gustavo Lah, CNM   400 mg of elemental calcium at 06/22/20 1359  . docusate sodium (COLACE) capsule 100 mg  100 mg Oral BID PRN McVey, Prudencio Pair, CNM      . famotidine (PEPCID) tablet 20 mg  20 mg Oral BID McVey, Rebecca A, CNM   20 mg at 06/23/20 0814  . hydrOXYzine (ATARAX/VISTARIL) tablet 50 mg  50 mg Oral Q6H PRN Gustavo Lah, CNM       Or  . hydrOXYzine (VISTARIL) injection 50 mg  50 mg Intramuscular Q6H PRN Margaretmary Eddy M, CNM      . loratadine (CLARITIN) tablet 10 mg  10 mg Oral Daily McVey, Rebecca A, CNM   10 mg at 06/23/20 1121  . melatonin tablet 5 mg  5 mg Oral QHS PRN Alberteen Sam, MD   5 mg at 06/22/20 2319  . metroNIDAZOLE (FLAGYL) tablet 2,000 mg  2,000 mg Oral Once McVey, Prudencio Pair, CNM      . morphine 2 MG/ML injection 2 mg  2 mg Intravenous Q4H PRN McVey, Prudencio Pair, CNM      . ondansetron (ZOFRAN) injection 4 mg  4 mg Intravenous Q8H PRN Gustavo Lah, CNM      . ondansetron (ZOFRAN-ODT) disintegrating tablet 4 mg  4 mg Oral Q6H McVey, Rebecca A, CNM   4 mg at 06/23/20 1353  . potassium chloride SA (KLOR-CON) CR tablet 40 mEq  40 mEq Oral TID Alberteen Sam, MD   40 mEq at 06/23/20 1021  . promethazine (PHENERGAN) tablet 12.5-25 mg  12.5-25 mg Oral Q4H PRN Gustavo Lah, CNM   25 mg at 06/22/20 2227   Or  . promethazine (PHENERGAN) suppository 12.5-25 mg  12.5-25 mg Rectal Q4H PRN Gustavo Lah,  CNM      . triamcinolone ointment (KENALOG) 0.1 %   Topical BID McVey, Prudencio Pair, CNM   Given at 06/23/20 1021    Musculoskeletal: Strength & Muscle Tone: normal  Gait & Station: normal  Patient leans: n/a   Psychiatric Specialty Exam: Physical Exam  Review of Systems  Blood pressure (!) 104/59, pulse 93, temperature 98.5 F (36.9 C), temperature source Oral, resp. rate 18, height 5' (1.524 m), weight 49 kg, last menstrual period 04/12/2020, SpO2 100 %.Body mass index is 21.09 kg/m.    Mental Status  Alert cooperative oriented times four Mood depressed affect depressed Anxious slightly  Consciousness not clouded or fluctuant Concentration and attention okay Rapport and eye contact okay Thought process and content ---depressive anxious themes, caretaking concerns school concerns No frank mania or psychosis  Movements no tics shakes tremors Judgement insight reliability okay Intelligence fund of knowledge norma; Abstraction normal Speech normal rate tone volume fluency                                                     Language English Akathisia none Handedness not known Aims not done Assets ----in HS trying  to stay stable reaches for help  ADL's okay Cognition okay  Psychomotor activity okay     \   Treatment Plan Summary:   Patient needs psych med followup clinic along with referals to p regnant women support groups, day treatment church groups and all at her request   No active SI HI or plans  She needs antidepressants to avoid risk to her during pregnancy and post partum however she needs continuity of care ---for this ---as an outpatient   Contracts for safety at discharge.   Disposition: home  Roselind Messier, MD 06/23/2020 2:14 PM

## 2020-06-23 NOTE — Progress Notes (Signed)
Pt is frustrated, feeling better today, and wanting to be discharged. Explained she still has psych consult pending and social work to come back to see her before she goes home. Encouraged pt to order lunch since she has only tolerated 1 meal since advancing to bland foods. Left messages for SW and Psych to see when they will be rounding. CNM updated.

## 2020-06-23 NOTE — Progress Notes (Signed)
[facility] PROGRESS NOTE    Brianna Lloyd  HTD:[id number] DOB: November 04, 2000 DOA: 06/21/2020 PCP: Patient, No Pcp Per      Brief Narrative:  Brianna Lloyd is a 19 y.o. F with schizoaffective disorder who presented with epigastric pain.  Of note the patient was found to be [redacted] weeks pregnant.  In the ER, lipase elevated, epigastric pain on exam.         Assessment & Plan:  Acute gallstone pancreatitis Lipase 585, AST 130, ALT 297, T bili 1.9, epigastric pain and vomiting on exam.  Denies alcohol use.  Not on prescription medications.  MRCP obtained, showed sludge without pancreatitis or stone in the CBD.   Patient was admitted, started on IV fluids and given bowel rest.  Her pain improved quickly, she was able to advance her diet to bland, low-fat today without pain, her LFTs have improved, and her lipase is going down today.  Likely passed stone.  No role for ERCP.  Discussed with Dr. Claudine Mouton, general surgery, and surgery as indicated.  This would be reasonable to perform during the second trimester, although although given this was a first ever episode, it would also be a reasonable alternative adhere to a strictly low-fat diet, and delay surgery till after delivery.  This was discussed with Heloise Ochoa, will work with the patient on shared decision making, and help arrange an appointment with Dr. Claudine Mouton after discharge.  From my standpoint, the patient is safe for discharge, with antiemetics, and general surgery follow-up to discuss feasibility of surgery during pregnancy or after.         Hypokalemia Repleted.               MDM: The below labs and imaging reports were reviewed and summarized above.  Medication management as above.         Subjective: The patient's epigastric pain is mostly resolved.  She is not requiring analgesia.  Her nausea is resolved, and she is able to tolerate solid foods without vomiting or epigastric pain.  No  fever.  Objective: Vitals:   06/23/20 0403 06/23/20 0412 06/23/20 0808 06/23/20 1126  BP: (!) 106/59  118/62 (!) 104/59  Pulse: 92  78 93  Resp: 18  20 18   Temp: 98.7 F (37.1 C)  98.9 F (37.2 C) 98.5 F (36.9 C)  TempSrc: Oral  Oral Oral  SpO2: 99%  100% 100%  Weight:  49 kg    Height:        Intake/Output Summary (Last 24 hours) at 06/23/2020 1325 Last data filed at 06/23/2020 1100 Gross per 24 hour  Intake 2238.35 ml  Output 1400 ml  Net 838.35 ml   Filed Weights   06/21/20 1730 06/22/20 0532 06/23/20 0412  Weight: 46 kg 48.1 kg 49 kg    Examination: General appearance: Thin adult female, lying in bed, watching television, interactive, no acute distress     HEENT: No nasal forming, discharge, or epistaxis, anicteric, conjunctive are pink, lids and lashes normal.  Lips moist, dentition normal, oropharynx moist, no oral lesions Skin:  Cardiac: RRR, no murmurs, no lower extremity edema Respiratory: Normal respiratory rate and rhythm, lungs clear without rales or wheezes Abdomen: Abdomen soft, no tenderness palpation or guarding, no ascites or distention.  No hepatosplenomegaly. MSK: Normal muscle bulk and tone. Neuro: Awake and alert, extraocular movements intact, moves all extremities with normal strength and coordination, speech fluent Psych: Sensorium intact responding to questions, attention normal, affect slightly blunted, judgment  insight appear normal.  No evidence of hallucinations, no delusional thought patterns.  Thought process and thought content seem normal to me.          Data Reviewed: I have personally reviewed following labs and imaging studies:  CBC: Recent Labs  Lab 06/21/20 1719 06/23/20 0612  WBC 5.2 4.8  HGB 14.7 10.2*  HCT 41.5 29.7*  MCV 84.3 87.4  PLT 336 212   Basic Metabolic Panel: Recent Labs  Lab 06/21/20 1719 06/22/20 0859 06/23/20 0612  NA 133* 132* 136  K 2.7* 3.3* 3.3*  CL 93* 105 107  CO2 22 19* 22  GLUCOSE 111* 84  88  BUN 10 6 <5*  CREATININE 0.52 0.57 0.46  CALCIUM 10.0 8.2* 8.6*  MG 1.9  --   --    GFR: Estimated Creatinine Clearance: 80.6 mL/min (by C-G formula based on SCr of 0.46 mg/dL). Liver Function Tests: Recent Labs  Lab 06/21/20 1719 06/22/20 0859 06/23/20 0612  AST 130* 99* 45*  ALT 297* 228* 161*  ALKPHOS 58 45 42  BILITOT 1.9* 1.7* 0.7  PROT 8.6* 6.1* 5.6*  ALBUMIN 4.6 3.4* 3.3*   Recent Labs  Lab 06/21/20 1719 06/22/20 1352 06/23/20 0612  LIPASE 585* 896* 608*  AMYLASE 599*  --   --    No results for input(s): AMMONIA in the last 168 hours. Coagulation Profile: No results for input(s): INR, PROTIME in the last 168 hours. Cardiac Enzymes: No results for input(s): CKTOTAL, CKMB, CKMBINDEX, TROPONINI in the last 168 hours. BNP (last 3 results) No results for input(s): PROBNP in the last 8760 hours. HbA1C: No results for input(s): HGBA1C in the last 72 hours. CBG: No results for input(s): GLUCAP in the last 168 hours. Lipid Profile: No results for input(s): CHOL, HDL, LDLCALC, TRIG, CHOLHDL, LDLDIRECT in the last 72 hours. Thyroid Function Tests: No results for input(s): TSH, T4TOTAL, FREET4, T3FREE, THYROIDAB in the last 72 hours. Anemia Panel: No results for input(s): VITAMINB12, FOLATE, FERRITIN, TIBC, IRON, RETICCTPCT in the last 72 hours. Urine analysis:    Component Value Date/Time   COLORURINE YELLOW (A) 06/01/2020 0458   APPEARANCEUR CLOUDY (A) 06/01/2020 0458   LABSPEC 1.030 06/01/2020 0458   PHURINE 5.0 06/01/2020 0458   GLUCOSEU NEGATIVE 06/01/2020 0458   HGBUR LARGE (A) 06/01/2020 0458   BILIRUBINUR NEGATIVE 06/01/2020 0458   KETONESUR 80 (A) 06/01/2020 0458   PROTEINUR >=300 (A) 06/01/2020 0458   NITRITE NEGATIVE 06/01/2020 0458   LEUKOCYTESUR SMALL (A) 06/01/2020 0458   Sepsis Labs: @LABRCNTIP (procalcitonin:4,lacticacidven:4)  ) Recent Results (from the past 240 hour(s))  SARS Coronavirus 2 by RT PCR (hospital order, performed in [facility] hospital lab) Nasopharyngeal Nasopharyngeal Swab     Status: None   Collection Time: 06/21/20  5:53 PM   Specimen: Nasopharyngeal Swab  Result Value Ref Range Status   SARS Coronavirus 2 NEGATIVE NEGATIVE Final    Comment: (NOTE) SARS-CoV-2 target nucleic acids are NOT DETECTED.  The SARS-CoV-2 RNA is generally detectable in upper and lower respiratory specimens during the acute phase of infection. The lowest concentration of SARS-CoV-2 viral copies this assay can detect is 250 copies / mL. A negative result does not preclude SARS-CoV-2 infection and should not be used as the sole basis for treatment or other patient management decisions.  A negative result may occur with improper specimen collection / handling, submission of specimen other than nasopharyngeal swab, presence of viral mutation(s) within the areas targeted by this assay, and inadequate number  of viral copies (<250 copies / mL). A negative result must be combined with clinical observations, patient history, and epidemiological information.  Fact Sheet for Patients:   BoilerBrush.com.cy  Fact Sheet for Healthcare Providers: https://pope.com/  This test is not yet approved or  cleared by the [place] FDA and has been authorized for detection and/or diagnosis of SARS-CoV-2 by FDA under an Emergency Use Authorization (EUA).  This EUA will remain in effect (meaning this test can be used) for the duration of the COVID-19 declaration under Section 564(b)(1) of the Act, 21 U.S.C. section 360bbb-3(b)(1), unless the authorization is terminated or revoked sooner.  Performed at [facility], [address] [place], [place] [address]          Radiology Studies: US Abdomen Complete  Result Date: 06/21/2020 CLINICAL DATA:  Abdominal pain, elevated serum lipase, pregnant EXAM: ABDOMEN ULTRASOUND COMPLETE COMPARISON:  None. FINDINGS: Gallbladder: The  gallbladder contains sludge, however, the gallbladder is not distended, there is no gallbladder wall thickening, and no pericholecystic fluid is identified. The sonographic Eulah Pont sign is reportedly negative. Common bile duct: Diameter: 3 mm in proximal diameter Liver: No focal lesion identified. Within normal limits in parenchymal echogenicity. Portal vein is patent on color Doppler imaging with normal direction of blood flow towards the liver. IVC: No abnormality visualized. Pancreas: Visualized portion unremarkable. Spleen: Size and appearance within normal limits. Right Kidney: Length: 9.4 cm. Echogenicity within normal limits. No mass or hydronephrosis visualized. Left Kidney: Length: 9.7 cm. Echogenicity within normal limits. No mass or hydronephrosis visualized. Abdominal aorta: No aneurysm visualized. Other findings: None. IMPRESSION: 1. Gallbladder sludge without evidence of acute cholecystitis. 2. Otherwise unremarkable abdominal ultrasound. Electronically Signed   By: Helyn Numbers MD   On: 06/21/2020 21:19   MR ABDOMEN MRCP WO CONTRAST  Result Date: 06/22/2020 CLINICAL DATA:  Pregnant, hyperemesis gravidarum, pancreatitis EXAM: MRI ABDOMEN WITHOUT CONTRAST  (INCLUDING MRCP) TECHNIQUE: Multiplanar multisequence MR imaging of the abdomen was performed. Heavily T2-weighted images of the biliary and pancreatic ducts were obtained, and three-dimensional MRCP images were rendered by post processing. COMPARISON:  None. FINDINGS: Lower chest: Lung bases are clear. Hepatobiliary: Liver is within normal limits. Gallbladder is unremarkable. No intrahepatic or extrahepatic ductal dilatation. Common duct measures 4 mm. No choledocholithiasis is seen. Pancreas: Within normal limits. No peripancreatic fluid/inflammatory changes. Spleen:  Within normal limits. Adrenals/Urinary Tract:  Adrenal glands are within normal limits. Kidneys are within normal limits.  No hydronephrosis. Stomach/Bowel: Stomach is within  normal limits. Visualized bowel is unremarkable. Vascular/Lymphatic:  No evidence of abdominal aortic aneurysm. No suspicious abdominal lymphadenopathy. Other:  No abdominal ascites. Musculoskeletal: No focal osseous lesions. IMPRESSION: Negative MRI abdomen. Specifically, pancreas is within normal limits. No cholelithiasis or choledocholithiasis. Electronically Signed   By: Charline Bills M.D.   On: 06/22/2020 05:17        Scheduled Meds: . famotidine  20 mg Oral BID  . loratadine  10 mg Oral Daily  . metroNIDAZOLE  2,000 mg Oral Once  . ondansetron  4 mg Oral Q6H  . potassium chloride  40 mEq Oral TID  . triamcinolone ointment   Topical BID   Continuous Infusions: . sodium chloride 500 mL (06/23/20 0856)     LOS: 1 day    Time spent: 25 minutes    Alberteen Sam, MD [facility] 06/23/2020, 1:25 PM     Please page though AMION or Epic secure chat:  For [employer], Higher education careers adviser

## 2020-06-23 NOTE — Progress Notes (Signed)
Reinforced Pt. Is on strict I/O to Pt. Who v/O. She states 2 voids this shift that she did not save in Urine Hat. Pt. States she will do so from now on. I replaced Urine hat on toilet that Pt. Had placed on Supply drawers.

## 2020-06-23 NOTE — Progress Notes (Signed)
ANTEPARTUM PROGRESS NOTE  Brianna Lloyd is a 19 y.o. G1P0 at [redacted]w[redacted]d with Estimated Date of Delivery: 01/17/21 who is admitted for hyperemesis gravidarum.  Length of Stay:  1 Days. Admitted 06/21/2020  Subjective: Pt reports improved nausea today, much improvement of epigastric pain this morning and denies need for pain medication.  - She has tolerated PO clear liquids and requesting to eat.  - tolerating PO antiemetics scheduled.  - c/o pain with IV - She reports no uterine contractions, no bleeding and no loss of fluid per vagina. - Has seen social work for housing referral and coordination of care, pending psych consult.   Vitals:  BP 118/62 (BP Location: Right Arm)   Pulse 78   Temp 98.9 F (37.2 C) (Oral)   Resp 20   Ht 5' (1.524 m)   Wt 49 kg   LMP 04/12/2020   SpO2 100%   BMI 21.09 kg/m   Physical Examination: CONSTITUTIONAL: Well-developed, well-nourished female in no acute distress.  HENT:  Normocephalic, atraumatic EYES: No scleral icterus.  NECK: Normal range of motion, supple, no masses SKIN: Skin is warm and dry. No rash noted. Not diaphoretic. No erythema. No pallor. NEUROLGIC: Alert and oriented to person, place, and time. Normal reflexes, muscle tone coordination. PSYCHIATRIC: Normal mood and affect. Normal behavior. Normal judgment and thought content. MUSCULOSKELETAL: Normal range of motion. No edema and no tenderness. 2+ distal pulses. ABDOMEN: Soft, nontender, nondistended; FHR via doppler 170bpm   Results for orders placed or performed during the hospital encounter of 06/21/20 (from the past 48 hour(s))  CBC on admission     Status: None   Collection Time: 06/21/20  5:19 PM  Result Value Ref Range   WBC 5.2 4.0 - 10.5 K/uL   RBC 4.92 3.87 - 5.11 MIL/uL   Hemoglobin 14.7 12.0 - 15.0 g/dL   HCT 86.7 36 - 46 %   MCV 84.3 80.0 - 100.0 fL   MCH 29.9 26.0 - 34.0 pg   MCHC 35.4 30.0 - 36.0 g/dL   RDW 67.2 09.4 - 70.9 %   Platelets 336 150 - 400 K/uL    nRBC 0.0 0.0 - 0.2 %    Comment: Performed at [facility], [address], [place], [place] [address]  Comprehensive metabolic panel     Status: Abnormal   Collection Time: 06/21/20  5:19 PM  Result Value Ref Range   Sodium 133 (L) 135 - 145 mmol/L   Potassium 2.7 (LL) 3.5 - 5.1 mmol/L    Comment: CRITICAL RESULT CALLED TO, READ BACK BY AND VERIFIED WITH MADDIE BUTLER AT 1750 06/21/20.PMF   Chloride 93 (L) 98 - 111 mmol/L   CO2 22 22 - 32 mmol/L   Glucose, Bld 111 (H) 70 - 99 mg/dL    Comment: Glucose reference range applies only to samples taken after fasting for at least 8 hours.   BUN 10 6 - 20 mg/dL   Creatinine, Ser 6.29 0.44 - 1.00 mg/dL   Calcium 47.6 8.9 - 54.6 mg/dL   Total Protein 8.6 (H) 6.5 - 8.1 g/dL   Albumin 4.6 3.5 - 5.0 g/dL   AST 503 (H) 15 - 41 U/L   ALT 297 (H) 0 - 44 U/L   Alkaline Phosphatase 58 38 - 126 U/L   Total Bilirubin 1.9 (H) 0.3 - 1.2 mg/dL   GFR calc non Af Amer >60 >60 mL/min   GFR calc Af Amer >60 >60 mL/min   Anion gap 18 (H)  5 - 15    Comment: Performed at [facility], [address] [place], [place] [address]  Lipase, blood     Status: Abnormal   Collection Time: 06/21/20  5:19 PM  Result Value Ref Range   Lipase 585 (H) 11 - 51 U/L    Comment: RESULT CONFIRMED BY MANUAL DILUTION.PMF Performed at [facility], [address] [place], [place] [address]   Amylase     Status: Abnormal   Collection Time: 06/21/20  5:19 PM  Result Value Ref Range   Amylase 599 (H) 28 - 100 U/L    Comment: Performed at [facility], [address] [place], [place] [address]  Magnesium     Status: None   Collection Time: 06/21/20  5:19 PM  Result Value Ref Range   Magnesium 1.9 1.7 - 2.4 mg/dL    Comment: Performed at [facility], [address] [place], [place] [address]  SARS Coronavirus 2 by RT PCR (hospital order, performed in [facility] [place] [facility] hospital lab) Nasopharyngeal Nasopharyngeal Swab     Status: None    Collection Time: 06/21/20  5:53 PM   Specimen: Nasopharyngeal Swab  Result Value Ref Range   SARS Coronavirus 2 NEGATIVE NEGATIVE    Comment: (NOTE) SARS-CoV-2 target nucleic acids are NOT DETECTED.  The SARS-CoV-2 RNA is generally detectable in upper and lower respiratory specimens during the acute phase of infection. The lowest concentration of SARS-CoV-2 viral copies this assay can detect is 250 copies / mL. A negative result does not preclude SARS-CoV-2 infection and should not be used as the sole basis for treatment or other patient management decisions.  A negative result may occur with improper specimen collection / handling, submission of specimen other than nasopharyngeal swab, presence of viral mutation(s) within the areas targeted by this assay, and inadequate number of viral copies (<250 copies / mL). A negative result must be combined with clinical observations, patient history, and epidemiological information.  Fact Sheet for Patients:   BoilerBrush.com.cy  Fact Sheet for Healthcare Providers: https://pope.com/  This test is not yet approved or  cleared by the [place] FDA and has been authorized for detection and/or diagnosis of SARS-CoV-2 by FDA under an Emergency Use Authorization (EUA).  This EUA will remain in effect (meaning this test can be used) for the duration of the COVID-19 declaration under Section 564(b)(1) of the Act, 21 U.S.C. section 360bbb-3(b)(1), unless the authorization is terminated or revoked sooner.  Performed at [facility], [address] [place], [place] [address]   Hepatitis panel, acute     Status: None   Collection Time: 06/21/20  9:14 PM  Result Value Ref Range   Hepatitis B Surface Ag NON REACTIVE NON REACTIVE   HCV Ab NON REACTIVE NON REACTIVE    Comment: (NOTE) Nonreactive HCV antibody screen is consistent with no HCV infections,  unless recent infection is  suspected or other evidence exists to indicate HCV infection.     Hep A IgM NON REACTIVE NON REACTIVE   Hep B C IgM NON REACTIVE NON REACTIVE    Comment: Performed at [facility], [address], [place], [place] [address]  Comprehensive metabolic panel     Status: Abnormal   Collection Time: 06/22/20  8:59 AM  Result Value Ref Range   Sodium 132 (L) 135 - 145 mmol/L   Potassium 3.3 (L) 3.5 - 5.1 mmol/L   Chloride 105 98 - 111 mmol/L   CO2 19 (  L) 22 - 32 mmol/L   Glucose, Bld 84 70 - 99 mg/dL    Comment: Glucose reference range applies only to samples taken after fasting for at least 8 hours.   BUN 6 6 - 20 mg/dL   Creatinine, Ser 1.610.57 0.44 - 1.00 mg/dL   Calcium 8.2 (L) 8.9 - 10.3 mg/dL   Total Protein 6.1 (L) 6.5 - 8.1 g/dL   Albumin 3.4 (L) 3.5 - 5.0 g/dL   AST 99 (H) 15 - 41 U/L   ALT 228 (H) 0 - 44 U/L   Alkaline Phosphatase 45 38 - 126 U/L   Total Bilirubin 1.7 (H) 0.3 - 1.2 mg/dL   GFR calc non Af Amer >60 >60 mL/min   GFR calc Af Amer >60 >60 mL/min   Anion gap 8 5 - 15    Comment: Performed at [facility], [address] [place], [place] [address]  Lipase, blood     Status: Abnormal   Collection Time: 06/22/20  1:52 PM  Result Value Ref Range   Lipase 896 (H) 11 - 51 U/L    Comment: RESULT CONFIRMED BY MANUAL DILUTION.PMF Performed at [facility], [address] [place], [place] [address]   Comprehensive metabolic panel     Status: Abnormal   Collection Time: 06/23/20  6:12 AM  Result Value Ref Range   Sodium 136 135 - 145 mmol/L   Potassium 3.3 (L) 3.5 - 5.1 mmol/L   Chloride 107 98 - 111 mmol/L   CO2 22 22 - 32 mmol/L   Glucose, Bld 88 70 - 99 mg/dL    Comment: Glucose reference range applies only to samples taken after fasting for at least 8 hours.   BUN <5 (L) 6 - 20 mg/dL   Creatinine, Ser 1.190.46 0.44 - 1.00 mg/dL   Calcium 8.6 (L) 8.9 - 10.3 mg/dL   Total Protein 5.6 (L) 6.5 - 8.1 g/dL   Albumin 3.3 (L) 3.5 - 5.0 g/dL   AST  45 (H) 15 - 41 U/L   ALT 161 (H) 0 - 44 U/L   Alkaline Phosphatase 42 38 - 126 U/L   Total Bilirubin 0.7 0.3 - 1.2 mg/dL   GFR calc non Af Amer >60 >60 mL/min   GFR calc Af Amer >60 >60 mL/min   Anion gap 7 5 - 15    Comment: Performed at [facility], [address] [place], [place] [address]  Lipase, blood     Status: Abnormal   Collection Time: 06/23/20  6:12 AM  Result Value Ref Range   Lipase 608 (H) 11 - 51 U/L    Comment: RESULT CONFIRMED BY MANUAL DILUTION SNG Performed at [facility], [address] [place], [place] [address]   CBC     Status: Abnormal   Collection Time: 06/23/20  6:12 AM  Result Value Ref Range   WBC 4.8 4.0 - 10.5 K/uL   RBC 3.40 (L) 3.87 - 5.11 MIL/uL   Hemoglobin 10.2 (L) 12.0 - 15.0 g/dL    Comment: REPEATED TO VERIFY   HCT 29.7 (L) 36 - 46 %   MCV 87.4 80.0 - 100.0 fL   MCH 30.0 26.0 - 34.0 pg   MCHC 34.3 30.0 - 36.0 g/dL   RDW 30.812.6 65.711.5 - 84.615.5 %   Platelets 212 150 - 400 K/uL   nRBC 0.0 0.0 - 0.2 %    Comment: Performed at [facility], [address] [place],  [place] [address]     Current scheduled medications . famotidine  20 mg Oral BID  . loratadine  10 mg Oral Daily  . metroNIDAZOLE  2,000 mg Oral Once  . ondansetron  4 mg Oral Q6H  . potassium chloride  40 mEq Oral TID  . triamcinolone ointment   Topical BID    I have reviewed the patient's current medications.  ASSESSMENT: Patient Active Problem List   Diagnosis Date Noted  . Hyperemesis complicating pregnancy, antepartum 06/21/2020  . Encounter for supervision of normal first pregnancy in first trimester 06/09/2020  . PTSD (post-traumatic stress disorder) 01/20/2016    PLAN:  Continue routine antenatal care. Improved CMP and lipase this am, voiding well and tolerating clear liquids. Will advance diet to bland regular.  Due to IV difficulties and tolerating PO diet well, will change to PO metronidazole for tx of Trichomoniasis, will give  one time 2 gram dose if able to tolerate breakfast and pre-medicate with PO phenergan and zofran prior to dose.  Consult to inpatient psych is pending, Pt with hx foster care, homelessness, recent death of intimate partner and "self treating" her depression. Pt reports having been on multiple medications in the past as a young teenager. Requesting evaluation for possible medication mgmt of perinatal depression. Social work involved for transition of care.     Randa Ngo, CNM 06/23/2020  11:08 AM

## 2020-06-26 LAB — OB RESULTS CONSOLE ABO/RH: RH Type: POSITIVE

## 2020-07-01 LAB — OB RESULTS CONSOLE ANTIBODY SCREEN: Antibody Screen: NEGATIVE

## 2020-07-01 LAB — OB RESULTS CONSOLE VARICELLA ZOSTER ANTIBODY, IGG: Varicella: IMMUNE

## 2020-07-01 LAB — OB RESULTS CONSOLE RUBELLA ANTIBODY, IGM: Rubella: IMMUNE

## 2020-11-06 ENCOUNTER — Other Ambulatory Visit: Payer: Self-pay | Admitting: Obstetrics & Gynecology

## 2020-11-06 NOTE — Progress Notes (Signed)
20 yo G1P0 at [redacted]w[redacted]d   H/H  8.7 / 27.0 Ferritin 8

## 2020-11-07 ENCOUNTER — Ambulatory Visit: Admission: RE | Admit: 2020-11-07 | Payer: Medicaid Other | Source: Ambulatory Visit

## 2020-11-07 ENCOUNTER — Encounter: Payer: Self-pay | Admitting: Certified Nurse Midwife

## 2020-12-12 ENCOUNTER — Other Ambulatory Visit: Payer: Self-pay

## 2020-12-12 ENCOUNTER — Observation Stay
Admission: EM | Admit: 2020-12-12 | Discharge: 2020-12-12 | Disposition: A | Discharge status: Home / Self Care | Payer: Medicaid Other

## 2020-12-12 ENCOUNTER — Encounter: Payer: Self-pay | Admitting: Obstetrics & Gynecology

## 2020-12-12 DIAGNOSIS — O1493 Unspecified pre-eclampsia, third trimester: Secondary | ICD-10-CM | POA: Diagnosis present

## 2020-12-12 DIAGNOSIS — O163 Unspecified maternal hypertension, third trimester: Principal | ICD-10-CM | POA: Insufficient documentation

## 2020-12-12 DIAGNOSIS — Z3A34 34 weeks gestation of pregnancy: Secondary | ICD-10-CM | POA: Diagnosis not present

## 2020-12-12 DIAGNOSIS — R519 Headache, unspecified: Secondary | ICD-10-CM | POA: Insufficient documentation

## 2020-12-12 DIAGNOSIS — O169 Unspecified maternal hypertension, unspecified trimester: Secondary | ICD-10-CM | POA: Diagnosis present

## 2020-12-12 DIAGNOSIS — Z791 Long term (current) use of non-steroidal anti-inflammatories (NSAID): Secondary | ICD-10-CM | POA: Insufficient documentation

## 2020-12-12 HISTORY — DX: Other specified health status: Z78.9

## 2020-12-12 LAB — CBC WITH DIFFERENTIAL/PLATELET
Abs Immature Granulocytes: 0.03 10*3/uL (ref 0.00–0.07)
Basophils Absolute: 0 10*3/uL (ref 0.0–0.1)
Basophils Relative: 0 %
Eosinophils Absolute: 0 10*3/uL (ref 0.0–0.5)
Eosinophils Relative: 1 %
HCT: 29.2 % — ABNORMAL LOW (ref 36.0–46.0)
Hemoglobin: 9.1 g/dL — ABNORMAL LOW (ref 12.0–15.0)
Immature Granulocytes: 1 %
Lymphocytes Relative: 27 %
Lymphs Abs: 1.6 10*3/uL (ref 0.7–4.0)
MCH: 27.8 pg (ref 26.0–34.0)
MCHC: 31.2 g/dL (ref 30.0–36.0)
MCV: 89.3 fL (ref 80.0–100.0)
Monocytes Absolute: 0.5 10*3/uL (ref 0.1–1.0)
Monocytes Relative: 8 %
Neutro Abs: 3.9 10*3/uL (ref 1.7–7.7)
Neutrophils Relative %: 63 %
Platelets: 258 10*3/uL (ref 150–400)
RBC: 3.27 MIL/uL — ABNORMAL LOW (ref 3.87–5.11)
RDW: 14.6 % (ref 11.5–15.5)
WBC: 6.1 10*3/uL (ref 4.0–10.5)
nRBC: 0 % (ref 0.0–0.2)

## 2020-12-12 LAB — COMPREHENSIVE METABOLIC PANEL
ALT: 8 U/L (ref 0–44)
AST: 17 U/L (ref 15–41)
Albumin: 2.8 g/dL — ABNORMAL LOW (ref 3.5–5.0)
Alkaline Phosphatase: 142 U/L — ABNORMAL HIGH (ref 38–126)
Anion gap: 6 (ref 5–15)
BUN: 7 mg/dL (ref 6–20)
CO2: 24 mmol/L (ref 22–32)
Calcium: 8.7 mg/dL — ABNORMAL LOW (ref 8.9–10.3)
Chloride: 107 mmol/L (ref 98–111)
Creatinine, Ser: 0.61 mg/dL (ref 0.44–1.00)
GFR, Estimated: >60 mL/min (ref >60)
Glucose, Bld: 75 mg/dL (ref 70–99)
Potassium: 3.8 mmol/L (ref 3.5–5.1)
Sodium: 137 mmol/L (ref 135–145)
Total Bilirubin: 0.3 mg/dL (ref 0.3–1.2)
Total Protein: 6.1 g/dL — ABNORMAL LOW (ref 6.5–8.1)

## 2020-12-12 LAB — PROTEIN / CREATININE RATIO, URINE
Creatinine, Urine: 18 mg/dL
Protein Creatinine Ratio: 0.83 mg/mg{Cre} — ABNORMAL HIGH (ref 0.00–0.15)
Total Protein, Urine: 15 mg/dL

## 2020-12-12 MED ORDER — CALCIUM CARBONATE ANTACID 500 MG PO CHEW: 2.0000 | CHEWABLE_TABLET | ORAL | Status: DC | PRN

## 2020-12-12 MED ORDER — ACETAMINOPHEN 325 MG PO TABS: 650.0000 mg | ORAL_TABLET | ORAL | Status: DC | PRN

## 2020-12-12 NOTE — Discharge Summary (Signed)
Brianna Lloyd is a 20 y.o. female. She is at 79w6dgestation. Patient's last menstrual period was 04/12/2020. Estimated Date of Delivery: 01/17/21  [facility]  Chief complaint: elevated blood pressure in office today   [name] presents today d/t elevated blood pressure in office.  Her initial b/p was 150/101, repeat 140/90.  She reports a headache that started this morning and resolved after eating.  She denies current HA, changes in vision, or RUQ pain.    S: Resting comfortably. no CTX, no VB.no LOF,  Active fetal movement.   Maternal Medical History:  Past Medical Hx:  has a past medical history of Aggressive behavior of adolescent and Medical history non-contributory.    Past Surgical Hx:  has a past surgical history that includes No past surgeries.   No Known Allergies   Prior to Admission medications   Medication Sig Start Date End Date Taking? Authorizing Provider  acetaminophen (TYLENOL) 325 MG tablet Take 2 tablets (650 mg total) by mouth every 6 (six) hours as needed (for pain scale < 4  OR  temperature  >/=  100.5 F). 06/23/20  Yes McVey, Rebecca A, CNM  ondansetron (ZOFRAN-ODT) 4 MG disintegrating tablet Take 1 tablet (4 mg total) by mouth every 6 (six) hours. 06/23/20  Yes McVey, RMurray Hodgkins CNM  Prenatal Vit-Fe Fumarate-FA (PRENATAL MULTIVITAMIN) TABS tablet Take 1 tablet by mouth daily at 12 noon.   Yes [provider]  sertraline (ZOLOFT) 25 MG tablet Take 1 tablet (25 mg total) by mouth daily. 06/23/20 07/23/20 Yes McVey, RMurray Hodgkins CNM  cetirizine (ZYRTEC) 10 MG tablet Take 10 mg by mouth daily. Patient not taking: No sig reported    [provider]  docusate sodium (COLACE) 100 MG capsule Take 1 capsule (100 mg total) by mouth 2 (two) times daily as needed for mild constipation. Patient not taking: Reported on 12/12/2020 06/23/20   McVey, RMurray Hodgkins CNM  famotidine (PEPCID) 20 MG tablet Take 1 tablet (20 mg total) by mouth 2  (two) times daily. Patient not taking: Reported on 12/12/2020 06/23/20   McVey, RMurray Hodgkins CNM  melatonin 5 MG TABS Take 1 tablet (5 mg total) by mouth at bedtime as needed (sleep). Patient not taking: Reported on 12/12/2020 06/23/20   McVey, RMurray Hodgkins CNM  promethazine (PHENERGAN) 12.5 MG tablet Take 1-2 tablets (12.5-25 mg total) by mouth every 4 (four) hours as needed for nausea or vomiting. Patient not taking: Reported on 12/12/2020 06/23/20   McVey, RMurray Hodgkins CNM  triamcinolone ointment (KENALOG) 0.1 % Apply topically 2 (two) times daily. Patient not taking: Reported on 12/12/2020 06/23/20   McVey, RMurray Hodgkins CNM  amphetamine-dextroamphetamine (ADDERALL XR) 15 MG 24 hr capsule Take 1 capsule by mouth daily with breakfast. 02/03/16 05/23/20  JOrlie Dakin MD  FLUoxetine (PROZAC) 20 MG capsule Take 1 capsule (20 mg total) by mouth daily. 02/03/16 05/24/20  JOrlie Dakin MD  risperiDONE (RISPERDAL) 0.5 MG tablet Take 1 tablet (0.5 mg total) by mouth daily. 02/03/16 05/24/20  JOrlie Dakin MD    Social History: She  reports that she has never smoked. She has never used smokeless tobacco. She reports current alcohol use. She reports current drug use. Drug: Marijuana.  Family History: family history includes Alcohol abuse in her father and mother; Schizophrenia in her father and mother. ,no history of gyn cancers  Review of Systems: A full review of systems was performed and negative except as noted in the HPI.  O:  BP (!) 130/93   Pulse 81   Temp 97.6 F (36.4 C) (Oral)   Resp 15   Ht 5' (1.524 m)   Wt 68 kg   LMP 04/12/2020   BMI 29.29 kg/m   12/12/20 1725 133/89  12/12/20 1710 123/85  12/12/20 1640 134/58Abnormal  12/12/20 1625 133/81  12/12/20 1620 129/76  12/12/20 1610 129/76    Results for orders placed or performed during the hospital encounter of 12/12/20 (from the past 48 hour(s))  CBC with Differential/Platelet   Collection Time: 12/12/20  4:48 PM  Result Value Ref Range    WBC 6.1 4.0 - 10.5 K/uL   RBC 3.27 (L) 3.87 - 5.11 MIL/uL   Hemoglobin 9.1 (L) 12.0 - 15.0 g/dL   HCT 29.2 (L) 36.0 - 46.0 %   MCV 89.3 80.0 - 100.0 fL   MCH 27.8 26.0 - 34.0 pg   MCHC 31.2 30.0 - 36.0 g/dL   RDW 14.6 11.5 - 15.5 %   Platelets 258 150 - 400 K/uL   nRBC 0.0 0.0 - 0.2 %   Neutrophils Relative % 63 %   Neutro Abs 3.9 1.7 - 7.7 K/uL   Lymphocytes Relative 27 %   Lymphs Abs 1.6 0.7 - 4.0 K/uL   Monocytes Relative 8 %   Monocytes Absolute 0.5 0.1 - 1.0 K/uL   Eosinophils Relative 1 %   Eosinophils Absolute 0.0 0.0 - 0.5 K/uL   Basophils Relative 0 %   Basophils Absolute 0.0 0.0 - 0.1 K/uL   Immature Granulocytes 1 %   Abs Immature Granulocytes 0.03 0.00 - 0.07 K/uL  Comprehensive metabolic panel   Collection Time: 12/12/20  4:48 PM  Result Value Ref Range   Sodium 137 135 - 145 mmol/L   Potassium 3.8 3.5 - 5.1 mmol/L   Chloride 107 98 - 111 mmol/L   CO2 24 22 - 32 mmol/L   Glucose, Bld 75 70 - 99 mg/dL   BUN 7 6 - 20 mg/dL   Creatinine, Ser 0.61 0.44 - 1.00 mg/dL   Calcium 8.7 (L) 8.9 - 10.3 mg/dL   Total Protein 6.1 (L) 6.5 - 8.1 g/dL   Albumin 2.8 (L) 3.5 - 5.0 g/dL   AST 17 15 - 41 U/L   ALT 8 0 - 44 U/L   Alkaline Phosphatase 142 (H) 38 - 126 U/L   Total Bilirubin 0.3 0.3 - 1.2 mg/dL   GFR, Estimated >60 >60 mL/min   Anion gap 6 5 - 15  Protein / creatinine ratio, urine   Collection Time: 12/12/20  5:09 PM  Result Value Ref Range   Creatinine, Urine 18 mg/dL   Total Protein, Urine 15 mg/dL   Protein Creatinine Ratio 0.83 (H) 0.00 - 0.15 mg/mg[Cre]     Constitutional: NAD, AAOx3  HE/ENT: extraocular movements grossly intact, moist mucous membranes CV: RRR PULM: nl respiratory effort, CTABL     Abd: gravid, non-tender, non-distended, soft      Ext: Non-tender, Nonedmeatous   Psych: mood appropriate, speech normal Pelvic : deferred  Fetal Monitor: Baseline: 125 bpm Variability: moderate Accels: Present Decels: none Toco: none  Category:  I   Assessment: 20 y.o. 29w6dhere for antenatal surveillance during pregnancy.  Principle diagnosis: Preeclampsia without severe features     Plan:   Fetal Wellbeing: Reassuring Cat 1 tracing.  Reactive NST   Blood pressures all WNL - no elevated blood pressures noted  Urine PCR elevated today - criteria met for preeclampsia  without severe features   IOL scheduled for 37 weeks - 12/27/2020 at 0001  Strict precautions reviewed   Check b/p at home daily - come to L&D for blood pressures 160/110, headache, changes in vision, or RUQ pain  D/c home stable, precautions reviewed, follow-up in office on Monday for blood pressure check and NST  ----- Drinda Butts, CNM Certified Nurse Midwife [facility]

## 2020-12-12 NOTE — OB Triage Note (Signed)
Patient Discharged home per provider. Pt educated about labor precautions and signs of elevated Blood pressure and  informed when to return to the ED for further evaluation. Pt instructed to keep all follow up appointments with her provider. AVS given to patient and RN answered all questions and patient has no further questions at this time. Pt discharged home in stable condition with significant other.

## 2020-12-12 NOTE — OB Triage Note (Addendum)
Pt is a 19yo G1P0 at [redacted]w[redacted]d that was sent from her OB for [facility] evaluation. Pt states she had a headache and saw spots this am but they resolved after she ate lunch. Pt states she has had elevated pressure in the past while not pregnant. Pt denies current HA, Blurred Vision, or epigastric pain. No edema or clonus present on assessment and reflexes Plus 2. Initial BP 129/76 and cycling Q15.

## 2020-12-19 LAB — OB RESULTS CONSOLE HIV ANTIBODY (ROUTINE TESTING): HIV: NONREACTIVE

## 2020-12-19 LAB — OB RESULTS CONSOLE GBS: GBS: POSITIVE

## 2020-12-19 LAB — OB RESULTS CONSOLE RPR: RPR: NONREACTIVE

## 2020-12-20 ENCOUNTER — Observation Stay
Admission: EM | Admit: 2020-12-20 | Discharge: 2020-12-20 | Disposition: A | Discharge status: Home / Self Care | Payer: Medicaid Other | Attending: Obstetrics and Gynecology | Admitting: Obstetrics and Gynecology

## 2020-12-20 DIAGNOSIS — Z79899 Other long term (current) drug therapy: Secondary | ICD-10-CM | POA: Insufficient documentation

## 2020-12-20 DIAGNOSIS — O1493 Unspecified pre-eclampsia, third trimester: Secondary | ICD-10-CM | POA: Diagnosis not present

## 2020-12-20 DIAGNOSIS — Z3A36 36 weeks gestation of pregnancy: Secondary | ICD-10-CM | POA: Diagnosis not present

## 2020-12-20 DIAGNOSIS — O133 Gestational [pregnancy-induced] hypertension without significant proteinuria, third trimester: Secondary | ICD-10-CM | POA: Diagnosis present

## 2020-12-20 LAB — CBC
HCT: 28.6 % — ABNORMAL LOW (ref 36.0–46.0)
Hemoglobin: 9 g/dL — ABNORMAL LOW (ref 12.0–15.0)
MCH: 27.8 pg (ref 26.0–34.0)
MCHC: 31.5 g/dL (ref 30.0–36.0)
MCV: 88.3 fL (ref 80.0–100.0)
Platelets: 241 10*3/uL (ref 150–400)
RBC: 3.24 MIL/uL — ABNORMAL LOW (ref 3.87–5.11)
RDW: 15 % (ref 11.5–15.5)
WBC: 5.7 10*3/uL (ref 4.0–10.5)
nRBC: 0 % (ref 0.0–0.2)

## 2020-12-20 LAB — COMPREHENSIVE METABOLIC PANEL
ALT: 11 U/L (ref 0–44)
AST: 19 U/L (ref 15–41)
Albumin: 2.6 g/dL — ABNORMAL LOW (ref 3.5–5.0)
Alkaline Phosphatase: 147 U/L — ABNORMAL HIGH (ref 38–126)
Anion gap: 7 (ref 5–15)
BUN: 5 mg/dL — ABNORMAL LOW (ref 6–20)
CO2: 21 mmol/L — ABNORMAL LOW (ref 22–32)
Calcium: 8.5 mg/dL — ABNORMAL LOW (ref 8.9–10.3)
Chloride: 106 mmol/L (ref 98–111)
Creatinine, Ser: 0.57 mg/dL (ref 0.44–1.00)
GFR, Estimated: >60 mL/min (ref >60)
Glucose, Bld: 64 mg/dL — ABNORMAL LOW (ref 70–99)
Potassium: 3.6 mmol/L (ref 3.5–5.1)
Sodium: 134 mmol/L — ABNORMAL LOW (ref 135–145)
Total Bilirubin: 0.3 mg/dL (ref 0.3–1.2)
Total Protein: 5.8 g/dL — ABNORMAL LOW (ref 6.5–8.1)

## 2020-12-20 LAB — PROTEIN / CREATININE RATIO, URINE
Creatinine, Urine: 59 mg/dL
Protein Creatinine Ratio: 1.08 mg/mg{Cre} — ABNORMAL HIGH (ref 0.00–0.15)
Total Protein, Urine: 64 mg/dL

## 2020-12-20 NOTE — Discharge Summary (Signed)
Brianna Lloyd is a 20 y.o. female. She is at 11w6dgestation. Patient's last menstrual period was 04/12/2020. Estimated Date of Delivery: 01/17/21  [facility]  Chief complaint: feeling dizzy and "unwell" today, seen in office for routine [place].   Preg complications: 1. Pre-eclampsia without severe features - elevated BP at 35wks, normal labs except proteinuria, P/C 0.83  2. Uterine S<D  11/28/2020 - measuring 30cm at 33 weeks  [place] growth/AFI:  12/12/2020 - EFW 2114g (13%)/ AC 9%; AFI 14.22cm at 55%  3. Anemia  10/17/20 (28 weeks) hgb 8.7, 12/20/20 hgb 9.0  10/31/20 Ferritin 8; set up for iron transfusion and sent to hematology  4. 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 GBS Pos via NOB urine cx  6. Hx Trich in pregnancy  [facility]   9/13 retested negative   12/30 wet prep neg for Trich  7. Social concerns  THC use in pregnancy, Self medicates for anxiety  Discussed UDS screen in each trimester   07/29/2020 (unable to get at [facility])  10/17/20 positive for Cannabinoid   8. Anxiety/depression   Started on Zoloft during antepartum admission for HG  PTSD - r/t abuse history, currently reports safe environment  Poor social support, limited resources   Long history of multiple foster homes and group homes   Case manager is emergency contact   [phone] of homelessness   Living with friend currently but not a long-term option   9. Pituitary adenoma   Need records of diagnosis   10. Hyperemesis gravidarum   Multiple visits to ED, also pancreatitis  S: Resting comfortably. no CTX, no VB.no LOF,  Active fetal movement. - She denies current HA, changes in vision, or RUQ pain.    Maternal Medical History:  Past Medical Hx:  has a past medical history of Aggressive behavior of adolescent and Medical history non-contributory.    Past Surgical Hx:  has a past  surgical history that includes No past surgeries.   No Known Allergies   Prior to Admission medications   Medication Sig Start Date End Date Taking? Authorizing Provider  acetaminophen (TYLENOL) 325 MG tablet Take 2 tablets (650 mg total) by mouth every 6 (six) hours as needed (for pain scale < 4  OR  temperature  >/=  100.5 F). 06/23/20  Yes McVey, Rebecca A, CNM  ondansetron (ZOFRAN-ODT) 4 MG disintegrating tablet Take 1 tablet (4 mg total) by mouth every 6 (six) hours. 06/23/20  Yes McVey, ReMurray HodgkinsCNM  Prenatal Vit-Fe Fumarate-FA (PRENATAL MULTIVITAMIN) TABS tablet Take 1 tablet by mouth daily at 12 noon.   Yes [provider]  sertraline (ZOLOFT) 25 MG tablet Take 1 tablet (25 mg total) by mouth daily. 06/23/20 07/23/20 Yes McVey, ReMurray HodgkinsCNM  cetirizine (ZYRTEC) 10 MG tablet Take 10 mg by mouth daily. Patient not taking: No sig reported    [provider]  docusate sodium (COLACE) 100 MG capsule Take 1 capsule (100 mg total) by mouth 2 (two) times daily as needed for mild constipation. Patient not taking: Reported on 12/12/2020 06/23/20   McVey, ReMurray HodgkinsCNM  famotidine (PEPCID) 20 MG tablet Take 1 tablet (20 mg total) by mouth 2 (two) times daily. Patient not taking: Reported on 12/12/2020 06/23/20   McVey, ReMurray HodgkinsCNM  melatonin 5 MG TABS Take 1 tablet (5 mg total) by mouth at bedtime as needed (sleep).  Patient not taking: Reported on 12/12/2020 06/23/20   McVey, Murray Hodgkins, CNM  promethazine (PHENERGAN) 12.5 MG tablet Take 1-2 tablets (12.5-25 mg total) by mouth every 4 (four) hours as needed for nausea or vomiting. Patient not taking: Reported on 12/12/2020 06/23/20   McVey, Murray Hodgkins, CNM  triamcinolone ointment (KENALOG) 0.1 % Apply topically 2 (two) times daily. Patient not taking: Reported on 12/12/2020 06/23/20   McVey, Murray Hodgkins, CNM  amphetamine-dextroamphetamine (ADDERALL XR) 15 MG 24 hr capsule Take 1 capsule by mouth daily with breakfast. 02/03/16 05/23/20  Orlie Dakin, MD  FLUoxetine (PROZAC) 20 MG capsule Take 1 capsule (20 mg total) by mouth daily. 02/03/16 05/24/20  Orlie Dakin, MD  risperiDONE (RISPERDAL) 0.5 MG tablet Take 1 tablet (0.5 mg total) by mouth daily. 02/03/16 05/24/20  Orlie Dakin, MD    Social History: She  reports that she has never smoked. She has never used smokeless tobacco. She reports current alcohol use. She reports current drug use. Drug: Marijuana.  Family History: family history includes Alcohol abuse in her father and mother; Schizophrenia in her father and mother. ,no history of gyn cancers  Review of Systems: A full review of systems was performed and negative except as noted in the HPI.    O:  BP (!) 144/69   Pulse 70   Temp 97.9 F (36.6 C) (Oral)   Resp 20   LMP 04/12/2020     12/20/20 1540 135/88  12/20/20 1553 135/91  12/20/20 1608 135/90  12/20/20 1622  133/81  12/20/20 1637 142/86  12/20/20 1652 141/84    Results for orders placed or performed during the hospital encounter of 12/20/20 (from the past 48 hour(s))  Protein / creatinine ratio, urine   Collection Time: 12/20/20  4:10 PM  Result Value Ref Range   Creatinine, Urine 59 mg/dL   Total Protein, Urine 64 mg/dL   Protein Creatinine Ratio 1.08 (H) 0.00 - 0.15 mg/mg[Cre]  Comprehensive metabolic panel   Collection Time: 12/20/20  4:37 PM  Result Value Ref Range   Sodium 134 (L) 135 - 145 mmol/L   Potassium 3.6 3.5 - 5.1 mmol/L   Chloride 106 98 - 111 mmol/L   CO2 21 (L) 22 - 32 mmol/L   Glucose, Bld 64 (L) 70 - 99 mg/dL   BUN 5 (L) 6 - 20 mg/dL   Creatinine, Ser 0.57 0.44 - 1.00 mg/dL   Calcium 8.5 (L) 8.9 - 10.3 mg/dL   Total Protein 5.8 (L) 6.5 - 8.1 g/dL   Albumin 2.6 (L) 3.5 - 5.0 g/dL   AST 19 15 - 41 U/L   ALT 11 0 - 44 U/L   Alkaline Phosphatase 147 (H) 38 - 126 U/L   Total Bilirubin 0.3 0.3 - 1.2 mg/dL   GFR, Estimated >60 >60 mL/min   Anion gap 7 5 - 15  CBC on admission   Collection Time: 12/20/20  4:37 PM  Result Value Ref Range    WBC 5.7 4.0 - 10.5 K/uL   RBC 3.24 (L) 3.87 - 5.11 MIL/uL   Hemoglobin 9.0 (L) 12.0 - 15.0 g/dL   HCT 28.6 (L) 36.0 - 46.0 %   MCV 88.3 80.0 - 100.0 fL   MCH 27.8 26.0 - 34.0 pg   MCHC 31.5 30.0 - 36.0 g/dL   RDW 15.0 11.5 - 15.5 %   Platelets 241 150 - 400 K/uL   nRBC 0.0 0.0 - 0.2 %     Constitutional: NAD, AAOx3  HE/ENT: extraocular movements grossly intact, moist mucous membranes CV: RRR PULM: nl respiratory effort, CTABL     Abd: gravid, non-tender, non-distended, soft      Ext: Non-tender, Nonedematous, DTR 2+, no clonus   Psych: mood appropriate, speech normal Pelvic : deferred  Fetal Monitor: Baseline: 135 bpm Variability: moderate Accels: Present Decels: none Toco: none  Category: I   Assessment: 20 y.o. 24w1dhere for antenatal surveillance during pregnancy.  Principle diagnosis: Preeclampsia without severe features     Plan:   Fetal Wellbeing: Reassuring Cat 1 tracing.  Reactive NST    Blood pressures normal to mild range  Urine PCR elevated today - criteria met for preeclampsia without severe features   CMP and CBC stable, c/w Dr SOuida Sills   IOL scheduled for 37 weeks - 12/27/2020 at 0001  Strict precautions reviewed   Check b/p at home daily - come to L&D for blood pressures 160/110, headache, changes in vision, or RUQ pain  D/c home stable, precautions reviewed[facility] ----- RFrancetta Found CNM  Certified Nurse Midwife [facility]

## 2020-12-20 NOTE — OB Triage Note (Signed)
Here for [facility] workup. Patient denies HA, abdominal pain, vision problems. State she just doesn't feel good. Complains of dizziness.

## 2020-12-21 ENCOUNTER — Other Ambulatory Visit: Payer: Self-pay | Admitting: Obstetrics and Gynecology

## 2020-12-21 NOTE — Progress Notes (Signed)
Dating: EDD: 01/17/21  by LMP: 04/12/20  and c/w [place] at 7+3wks.   Preg c/b: 1. Pre-eclampsia without severe features - elevated BP at 35wks, normal labs except proteinuria, P/C 0.83  2. Uterine S<D  11/28/2020 - measuring 30cm at 33 weeks  [place] for growth/AFI:  12/12/2020 - EFW 2114g (13%)/ AC 9%; AFI 14.22cm at 55%  3. Anemia  10/17/20 (28 weeks) hgb 8.7, 12/20/20 hgb 9.0  10/31/20 Ferritin 8; set up for iron transfusion and sent to hematology  4. 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 GBS Pos via NOB urine cx  6. Hx Trich in pregnancy  8/28 tested positive at [facility] - treated   9/13 retested negative   12/30 wet prep neg for Trich  7. Social concerns  THC use in pregnancy, Self medicates for anxiety  Discussed UDS screen in each trimester   07/29/2020 (unable to get at [facility])  10/17/20 positive for Cannabinoid   8. Anxiety/depression   Started on Zoloft during antepartum admission for HG  PTSD - r/t abuse history, currently reports safe environment  Poor social support, limited resources   Long history of multiple foster homes and group homes   Case manager is emergency contact   10/19/20 [phone]  History of homelessness   Living with friend currently but not a long-term option   9. Pituitary adenoma   Need records of diagnosis   10. Hyperemesis gravidarum   Multiple visits to ED, also pancreatitis  Prenatal Labs: Blood type/Rh B Pos  Antibody screen neg  Rubella Immune  Varicella Immune  RPR NR  HBsAg Neg  HIV NR  GC neg  Chlamydia neg  Genetic screening negative  1 hour GTT 98  3 hour GTT  n/a  GBS Pos    Contraception: TBD Infant feeding: TBD Tdap/Flu: declined

## 2020-12-22 ENCOUNTER — Telehealth: Payer: Self-pay | Admitting: *Deleted

## 2020-12-23 ENCOUNTER — Other Ambulatory Visit: Payer: Self-pay

## 2020-12-23 DIAGNOSIS — O99013 Anemia complicating pregnancy, third trimester: Secondary | ICD-10-CM

## 2020-12-23 DIAGNOSIS — D509 Iron deficiency anemia, unspecified: Secondary | ICD-10-CM

## 2020-12-26 ENCOUNTER — Ambulatory Visit
Admission: RE | Admit: 2020-12-26 | Discharge: 2020-12-26 | Disposition: A | Discharge status: Home / Self Care | Payer: Medicaid Other | Source: Ambulatory Visit

## 2020-12-26 ENCOUNTER — Other Ambulatory Visit: Payer: Self-pay

## 2020-12-26 DIAGNOSIS — O99013 Anemia complicating pregnancy, third trimester: Secondary | ICD-10-CM

## 2020-12-26 DIAGNOSIS — D509 Iron deficiency anemia, unspecified: Secondary | ICD-10-CM | POA: Insufficient documentation

## 2020-12-26 MED ORDER — SODIUM CHLORIDE 0.9 % IV SOLN
100.0000 mg | Freq: Once | INTRAVENOUS | Status: AC
  Administered 2020-12-26: 100 mg via INTRAVENOUS
  Filled 2020-12-26: qty 5

## 2020-12-27 ENCOUNTER — Other Ambulatory Visit: Payer: Self-pay

## 2020-12-27 ENCOUNTER — Inpatient Hospital Stay: Payer: Medicaid Other | Admitting: Anesthesiology

## 2020-12-27 ENCOUNTER — Encounter
Admission: EM | Disposition: A | Discharge status: Home / Self Care | Payer: Self-pay | Source: Home / Self Care | Attending: Obstetrics and Gynecology

## 2020-12-27 ENCOUNTER — Encounter: Payer: Self-pay | Admitting: Obstetrics and Gynecology

## 2020-12-27 ENCOUNTER — Inpatient Hospital Stay
Admission: EM | Admit: 2020-12-27 | Discharge: 2020-12-29 | DRG: 787 | Disposition: A | Discharge status: Home / Self Care | Payer: Medicaid Other | Attending: Obstetrics and Gynecology | Admitting: Obstetrics and Gynecology

## 2020-12-27 DIAGNOSIS — A6000 Herpesviral infection of urogenital system, unspecified: Secondary | ICD-10-CM | POA: Diagnosis present

## 2020-12-27 DIAGNOSIS — Z3A37 37 weeks gestation of pregnancy: Secondary | ICD-10-CM

## 2020-12-27 DIAGNOSIS — F32A Depression, unspecified: Secondary | ICD-10-CM | POA: Diagnosis present

## 2020-12-27 DIAGNOSIS — F4310 Post-traumatic stress disorder, unspecified: Secondary | ICD-10-CM | POA: Diagnosis present

## 2020-12-27 DIAGNOSIS — O99824 Streptococcus B carrier state complicating childbirth: Secondary | ICD-10-CM | POA: Diagnosis present

## 2020-12-27 DIAGNOSIS — D509 Iron deficiency anemia, unspecified: Secondary | ICD-10-CM | POA: Diagnosis present

## 2020-12-27 DIAGNOSIS — O1404 Mild to moderate pre-eclampsia, complicating childbirth: Secondary | ICD-10-CM | POA: Diagnosis present

## 2020-12-27 DIAGNOSIS — O9832 Other infections with a predominantly sexual mode of transmission complicating childbirth: Secondary | ICD-10-CM | POA: Diagnosis present

## 2020-12-27 DIAGNOSIS — O9902 Anemia complicating childbirth: Secondary | ICD-10-CM | POA: Diagnosis present

## 2020-12-27 DIAGNOSIS — O1493 Unspecified pre-eclampsia, third trimester: Secondary | ICD-10-CM

## 2020-12-27 DIAGNOSIS — Z20822 Contact with and (suspected) exposure to covid-19: Secondary | ICD-10-CM | POA: Diagnosis present

## 2020-12-27 DIAGNOSIS — O99344 Other mental disorders complicating childbirth: Secondary | ICD-10-CM | POA: Diagnosis present

## 2020-12-27 DIAGNOSIS — D649 Anemia, unspecified: Secondary | ICD-10-CM | POA: Diagnosis present

## 2020-12-27 LAB — COMPREHENSIVE METABOLIC PANEL
ALT: 14 U/L (ref 0–44)
AST: 25 U/L (ref 15–41)
Albumin: 2.7 g/dL — ABNORMAL LOW (ref 3.5–5.0)
Alkaline Phosphatase: 144 U/L — ABNORMAL HIGH (ref 38–126)
Anion gap: 8 (ref 5–15)
BUN: 11 mg/dL (ref 6–20)
CO2: 21 mmol/L — ABNORMAL LOW (ref 22–32)
Calcium: 9 mg/dL (ref 8.9–10.3)
Chloride: 105 mmol/L (ref 98–111)
Creatinine, Ser: 0.61 mg/dL (ref 0.44–1.00)
GFR, Estimated: >60 mL/min (ref >60)
Glucose, Bld: 78 mg/dL (ref 70–99)
Potassium: 4 mmol/L (ref 3.5–5.1)
Sodium: 134 mmol/L — ABNORMAL LOW (ref 135–145)
Total Bilirubin: 0.5 mg/dL (ref 0.3–1.2)
Total Protein: 6.2 g/dL — ABNORMAL LOW (ref 6.5–8.1)

## 2020-12-27 LAB — OB RESULTS CONSOLE GC/CHLAMYDIA
Chlamydia: NEGATIVE
Gonorrhea: NEGATIVE

## 2020-12-27 LAB — WET PREP, GENITAL
Sperm: NONE SEEN
Trich, Wet Prep: NONE SEEN
Yeast Wet Prep HPF POC: NONE SEEN

## 2020-12-27 LAB — TYPE AND SCREEN
ABO/RH(D): B POS
Antibody Screen: NEGATIVE

## 2020-12-27 LAB — URINE DRUG SCREEN, QUALITATIVE (ARMC ONLY)
Amphetamines, Ur Screen: NOT DETECTED
Barbiturates, Ur Screen: NOT DETECTED
Benzodiazepine, Ur Scrn: NOT DETECTED
Cannabinoid 50 Ng, Ur ~~LOC~~: NOT DETECTED
Cocaine Metabolite,Ur ~~LOC~~: NOT DETECTED
MDMA (Ecstasy)Ur Screen: NOT DETECTED
Methadone Scn, Ur: NOT DETECTED
Opiate, Ur Screen: NOT DETECTED
Phencyclidine (PCP) Ur S: NOT DETECTED
Tricyclic, Ur Screen: NOT DETECTED

## 2020-12-27 LAB — CBC
HCT: 28.3 % — ABNORMAL LOW (ref 36.0–46.0)
Hemoglobin: 8.8 g/dL — ABNORMAL LOW (ref 12.0–15.0)
MCH: 27.1 pg (ref 26.0–34.0)
MCHC: 31.1 g/dL (ref 30.0–36.0)
MCV: 87.1 fL (ref 80.0–100.0)
Platelets: 251 10*3/uL (ref 150–400)
RBC: 3.25 MIL/uL — ABNORMAL LOW (ref 3.87–5.11)
RDW: 15.4 % (ref 11.5–15.5)
WBC: 7 10*3/uL (ref 4.0–10.5)
nRBC: 0 % (ref 0.0–0.2)

## 2020-12-27 LAB — RESP PANEL BY RT-PCR (FLU A&B, COVID) ARPGX2
Influenza A by PCR: NEGATIVE
Influenza B by PCR: NEGATIVE
SARS Coronavirus 2 by RT PCR: NEGATIVE

## 2020-12-27 LAB — CHLAMYDIA/NGC RT PCR (ARMC ONLY)
Chlamydia Tr: NOT DETECTED
N gonorrhoeae: NOT DETECTED

## 2020-12-27 LAB — PROTEIN / CREATININE RATIO, URINE
Creatinine, Urine: 42 mg/dL
Protein Creatinine Ratio: 1.21 mg/mg{Cre} — ABNORMAL HIGH (ref 0.00–0.15)
Total Protein, Urine: 51 mg/dL

## 2020-12-27 LAB — RPR: RPR Ser Ql: NONREACTIVE

## 2020-12-27 LAB — ABO/RH: ABO/RH(D): B POS

## 2020-12-27 SURGERY — Surgical Case
Anesthesia: Spinal

## 2020-12-27 MED ORDER — CEFAZOLIN SODIUM-DEXTROSE 2-4 GM/100ML-% IV SOLN
INTRAVENOUS | Status: AC
  Filled 2020-12-27: qty 100

## 2020-12-27 MED ORDER — MORPHINE SULFATE (PF) 0.5 MG/ML IJ SOLN
INTRAMUSCULAR | Status: AC
  Filled 2020-12-27: qty 10

## 2020-12-27 MED ORDER — TERBUTALINE SULFATE 1 MG/ML IJ SOLN: 0.2500 mg | Freq: Once | INTRAMUSCULAR | Status: DC | PRN

## 2020-12-27 MED ORDER — MENTHOL 3 MG MT LOZG
1.0000 | LOZENGE | OROMUCOSAL | Status: DC | PRN
  Filled 2020-12-27: qty 9

## 2020-12-27 MED ORDER — ACETAMINOPHEN 325 MG PO TABS: 650.0000 mg | ORAL_TABLET | ORAL | Status: DC | PRN

## 2020-12-27 MED ORDER — DEXTROSE 5 % IV SOLN
1.0000 ug/kg/h | INTRAVENOUS | Status: DC | PRN
  Filled 2020-12-27: qty 5

## 2020-12-27 MED ORDER — DIPHENHYDRAMINE HCL 25 MG PO CAPS
25.0000 mg | ORAL_CAPSULE | ORAL | Status: DC | PRN
  Filled 2020-12-27 (×2): qty 1

## 2020-12-27 MED ORDER — SODIUM CHLORIDE 0.9 % IV SOLN: 5.0000 10*6.[IU] | Freq: Once | INTRAVENOUS | Status: DC

## 2020-12-27 MED ORDER — ONDANSETRON HCL 4 MG/2ML IJ SOLN: 4.0000 mg | Freq: Four times a day (QID) | INTRAMUSCULAR | Status: DC | PRN

## 2020-12-27 MED ORDER — BUPIVACAINE IN DEXTROSE 0.75-8.25 % IT SOLN
INTRATHECAL | Status: DC | PRN
  Administered 2020-12-27: 1.6 mL via INTRATHECAL

## 2020-12-27 MED ORDER — IBUPROFEN 800 MG PO TABS
800.0000 mg | ORAL_TABLET | Freq: Three times a day (TID) | ORAL | Status: DC
  Administered 2020-12-28 – 2020-12-29 (×3): 800 mg via ORAL
  Filled 2020-12-27 (×4): qty 1

## 2020-12-27 MED ORDER — NALBUPHINE HCL 10 MG/ML IJ SOLN: 5.0000 mg | INTRAMUSCULAR | Status: DC | PRN

## 2020-12-27 MED ORDER — SODIUM CHLORIDE 0.9 % IV SOLN
200.0000 mg | Freq: Once | INTRAVENOUS | Status: AC
  Administered 2020-12-27: 200 mg via INTRAVENOUS
  Filled 2020-12-27: qty 10

## 2020-12-27 MED ORDER — OXYCODONE HCL 5 MG PO TABS
10.0000 mg | ORAL_TABLET | ORAL | Status: DC | PRN
  Administered 2020-12-28 – 2020-12-29 (×5): 10 mg via ORAL
  Filled 2020-12-27 (×5): qty 2

## 2020-12-27 MED ORDER — OXYCODONE-ACETAMINOPHEN 5-325 MG PO TABS: 1.0000 | ORAL_TABLET | ORAL | Status: DC | PRN

## 2020-12-27 MED ORDER — SENNOSIDES-DOCUSATE SODIUM 8.6-50 MG PO TABS
2.0000 | ORAL_TABLET | ORAL | Status: DC
  Administered 2020-12-27 – 2020-12-29 (×3): 2 via ORAL
  Filled 2020-12-27 (×3): qty 2

## 2020-12-27 MED ORDER — NALBUPHINE HCL 10 MG/ML IJ SOLN: 5.0000 mg | Freq: Once | INTRAMUSCULAR | Status: DC | PRN

## 2020-12-27 MED ORDER — NALOXONE HCL 0.4 MG/ML IJ SOLN: 0.4000 mg | INTRAMUSCULAR | Status: DC | PRN

## 2020-12-27 MED ORDER — TERBUTALINE SULFATE 1 MG/ML IJ SOLN: 0.2500 mg | Freq: Once | INTRAMUSCULAR | Status: AC

## 2020-12-27 MED ORDER — MISOPROSTOL 25 MCG QUARTER TABLET: 25.0000 ug | ORAL_TABLET | ORAL | Status: DC | PRN

## 2020-12-27 MED ORDER — LIDOCAINE HCL (PF) 1 % IJ SOLN: 30.0000 mL | INTRAMUSCULAR | Status: DC | PRN

## 2020-12-27 MED ORDER — ONDANSETRON HCL 4 MG/2ML IJ SOLN
INTRAMUSCULAR | Status: AC
  Filled 2020-12-27: qty 2

## 2020-12-27 MED ORDER — LACTATED RINGERS IV SOLN: 500.0000 mL | INTRAVENOUS | Status: DC | PRN

## 2020-12-27 MED ORDER — KETOROLAC TROMETHAMINE 30 MG/ML IJ SOLN
INTRAMUSCULAR | Status: AC
  Filled 2020-12-27: qty 1

## 2020-12-27 MED ORDER — ZOLPIDEM TARTRATE 5 MG PO TABS: 5.0000 mg | ORAL_TABLET | Freq: Every evening | ORAL | Status: DC | PRN

## 2020-12-27 MED ORDER — SODIUM CHLORIDE 0.9 % IV SOLN
INTRAVENOUS | Status: DC | PRN
  Administered 2020-12-27: 50 ug/min via INTRAVENOUS

## 2020-12-27 MED ORDER — OXYTOCIN BOLUS FROM INFUSION: 333.0000 mL | Freq: Once | INTRAVENOUS | Status: DC

## 2020-12-27 MED ORDER — FENTANYL CITRATE (PF) 100 MCG/2ML IJ SOLN
INTRAMUSCULAR | Status: DC | PRN
  Administered 2020-12-27: 15 ug via INTRATHECAL

## 2020-12-27 MED ORDER — TERBUTALINE SULFATE 1 MG/ML IJ SOLN
INTRAMUSCULAR | Status: AC
  Administered 2020-12-27: 0.25 mg via SUBCUTANEOUS
  Filled 2020-12-27: qty 1

## 2020-12-27 MED ORDER — DIPHENHYDRAMINE HCL 50 MG/ML IJ SOLN: 12.5000 mg | INTRAMUSCULAR | Status: DC | PRN

## 2020-12-27 MED ORDER — DIPHENHYDRAMINE HCL 25 MG PO CAPS
25.0000 mg | ORAL_CAPSULE | Freq: Four times a day (QID) | ORAL | Status: DC | PRN
  Administered 2020-12-28 – 2020-12-29 (×4): 25 mg via ORAL
  Filled 2020-12-27 (×2): qty 1

## 2020-12-27 MED ORDER — SIMETHICONE 80 MG PO CHEW
80.0000 mg | CHEWABLE_TABLET | Freq: Four times a day (QID) | ORAL | Status: DC
  Administered 2020-12-27 – 2020-12-29 (×9): 80 mg via ORAL
  Filled 2020-12-27 (×9): qty 1

## 2020-12-27 MED ORDER — NALBUPHINE HCL 10 MG/ML IJ SOLN
5.0000 mg | Freq: Once | INTRAMUSCULAR | Status: DC | PRN
Start: 2020-12-27 — End: 2020-12-29

## 2020-12-27 MED ORDER — ONDANSETRON HCL 4 MG/2ML IJ SOLN
4.0000 mg | Freq: Three times a day (TID) | INTRAMUSCULAR | Status: DC | PRN
Start: 2020-12-27 — End: 2020-12-29

## 2020-12-27 MED ORDER — LIDOCAINE 5 % EX PTCH
MEDICATED_PATCH | CUTANEOUS | Status: AC
  Filled 2020-12-27: qty 1

## 2020-12-27 MED ORDER — KETOROLAC TROMETHAMINE 30 MG/ML IJ SOLN: 30.0000 mg | Freq: Four times a day (QID) | INTRAMUSCULAR | Status: AC

## 2020-12-27 MED ORDER — LIDOCAINE 5 % EX PTCH
MEDICATED_PATCH | CUTANEOUS | Status: DC | PRN
  Administered 2020-12-27: 1 via TRANSDERMAL

## 2020-12-27 MED ORDER — LACTATED RINGERS IV SOLN: INTRAVENOUS | Status: DC

## 2020-12-27 MED ORDER — MEPERIDINE HCL 50 MG/ML IJ SOLN: 6.2500 mg | INTRAMUSCULAR | Status: DC | PRN

## 2020-12-27 MED ORDER — ACETAMINOPHEN 325 MG PO TABS
650.0000 mg | ORAL_TABLET | Freq: Four times a day (QID) | ORAL | Status: AC
  Administered 2020-12-27 – 2020-12-28 (×4): 650 mg via ORAL
  Filled 2020-12-27 (×4): qty 2

## 2020-12-27 MED ORDER — PENICILLIN G POT IN DEXTROSE 60000 UNIT/ML IV SOLN
3.0000 10*6.[IU] | INTRAVENOUS | Status: DC
Start: 2020-12-27 — End: 2020-12-27

## 2020-12-27 MED ORDER — KETOROLAC TROMETHAMINE 30 MG/ML IJ SOLN
30.0000 mg | Freq: Four times a day (QID) | INTRAMUSCULAR | Status: AC
  Administered 2020-12-27 – 2020-12-28 (×4): 30 mg via INTRAVENOUS
  Filled 2020-12-27 (×4): qty 1

## 2020-12-27 MED ORDER — ONDANSETRON HCL 4 MG/2ML IJ SOLN
INTRAMUSCULAR | Status: DC | PRN
  Administered 2020-12-27: 4 mg via INTRAVENOUS

## 2020-12-27 MED ORDER — OXYTOCIN-SODIUM CHLORIDE 30-0.9 UT/500ML-% IV SOLN: 1.0000 m[IU]/min | INTRAVENOUS | Status: DC

## 2020-12-27 MED ORDER — SOD CITRATE-CITRIC ACID 500-334 MG/5ML PO SOLN
30.0000 mL | ORAL | Status: DC | PRN
Start: 2020-12-27 — End: 2020-12-27
  Administered 2020-12-27: 30 mL via ORAL
  Filled 2020-12-27: qty 15

## 2020-12-27 MED ORDER — OXYTOCIN-SODIUM CHLORIDE 30-0.9 UT/500ML-% IV SOLN
2.5000 [IU]/h | INTRAVENOUS | Status: DC
  Administered 2020-12-27: 30 [IU] via INTRAVENOUS
  Filled 2020-12-27: qty 1000

## 2020-12-27 MED ORDER — MORPHINE SULFATE (PF) 0.5 MG/ML IJ SOLN
INTRAMUSCULAR | Status: DC | PRN
  Administered 2020-12-27: .1 mg via INTRATHECAL

## 2020-12-27 MED ORDER — PRENATAL MULTIVITAMIN CH
1.0000 | ORAL_TABLET | Freq: Every day | ORAL | Status: DC
  Administered 2020-12-27 – 2020-12-29 (×3): 1 via ORAL
  Filled 2020-12-27 (×3): qty 1

## 2020-12-27 MED ORDER — FENTANYL CITRATE (PF) 100 MCG/2ML IJ SOLN
INTRAMUSCULAR | Status: AC
  Filled 2020-12-27: qty 2

## 2020-12-27 MED ORDER — SODIUM CHLORIDE 0.9% FLUSH: 3.0000 mL | INTRAVENOUS | Status: DC | PRN

## 2020-12-27 MED ORDER — KETOROLAC TROMETHAMINE 30 MG/ML IJ SOLN
INTRAMUSCULAR | Status: DC | PRN
  Administered 2020-12-27: 30 mg via INTRAVENOUS

## 2020-12-27 MED ORDER — OXYTOCIN-SODIUM CHLORIDE 30-0.9 UT/500ML-% IV SOLN
2.5000 [IU]/h | INTRAVENOUS | Status: AC
  Administered 2020-12-27: 2.5 [IU]/h via INTRAVENOUS

## 2020-12-27 MED ORDER — CEFAZOLIN SODIUM-DEXTROSE 2-4 GM/100ML-% IV SOLN
2.0000 g | Freq: Three times a day (TID) | INTRAVENOUS | Status: DC
  Administered 2020-12-27: 2 g via INTRAVENOUS
  Filled 2020-12-27 (×3): qty 100

## 2020-12-27 MED ORDER — OXYCODONE HCL 5 MG PO TABS
5.0000 mg | ORAL_TABLET | ORAL | Status: DC | PRN
  Administered 2020-12-27 – 2020-12-29 (×2): 5 mg via ORAL
  Filled 2020-12-27 (×3): qty 1

## 2020-12-27 MED ORDER — FENTANYL CITRATE (PF) 100 MCG/2ML IJ SOLN: 50.0000 ug | INTRAMUSCULAR | Status: DC | PRN

## 2020-12-27 SURGICAL SUPPLY — 31 items
ADHESIVE MASTISOL STRL (MISCELLANEOUS) ×2 IMPLANT
BAG COUNTER SPONGE EZ (MISCELLANEOUS) ×2 IMPLANT
CANISTER SUCT 3000ML PPV (MISCELLANEOUS) ×2 IMPLANT
CHLORAPREP W/TINT 26 (MISCELLANEOUS) ×4 IMPLANT
COVER WAND RF STERILE (DRAPES) ×2 IMPLANT
DERMABOND ADHESIVE PROPEN (GAUZE/BANDAGES/DRESSINGS) ×1
DERMABOND ADVANCED .7 DNX6 (GAUZE/BANDAGES/DRESSINGS) ×1 IMPLANT
DRSG TELFA 3X8 NADH (GAUZE/BANDAGES/DRESSINGS) ×2 IMPLANT
GAUZE SPONGE 4X4 12PLY STRL (GAUZE/BANDAGES/DRESSINGS) ×2 IMPLANT
GLOVE BIOGEL PI ORTHO PRO 7.5 (GLOVE) ×1
GLOVE PI ORTHO PRO STRL 7.5 (GLOVE) ×1 IMPLANT
GOWN STRL REUS W/ TWL LRG LVL3 (GOWN DISPOSABLE) ×2 IMPLANT
GOWN STRL REUS W/TWL LRG LVL3 (GOWN DISPOSABLE) ×2
KIT TURNOVER KIT A (KITS) ×2 IMPLANT
MANIFOLD NEPTUNE II (INSTRUMENTS) ×2 IMPLANT
MAT PREVALON FULL STRYKER (MISCELLANEOUS) ×2 IMPLANT
NS IRRIG 1000ML POUR BTL (IV SOLUTION) ×2 IMPLANT
PACK C SECTION AR (MISCELLANEOUS) ×2 IMPLANT
PAD OB MATERNITY 4.3X12.25 (PERSONAL CARE ITEMS) ×2 IMPLANT
PAD PREP 24X41 OB/GYN DISP (PERSONAL CARE ITEMS) ×2 IMPLANT
PENCIL SMOKE EVACUATOR (MISCELLANEOUS) ×2 IMPLANT
RETRACTOR WND ALEXIS-O 25 LRG (MISCELLANEOUS) ×1 IMPLANT
RTRCTR C-SECT PINK 25CM LRG (MISCELLANEOUS) ×2 IMPLANT
RTRCTR WOUND ALEXIS O 25CM LRG (MISCELLANEOUS) ×2
SPONGE GAUZE 4X4 12PLY (GAUZE/BANDAGES/DRESSINGS) ×2 IMPLANT
SPONGE LAP 18X18 RF (DISPOSABLE) ×2 IMPLANT
SUT VIC AB 0 CTX 36 (SUTURE) ×2
SUT VIC AB 0 CTX36XBRD ANBCTRL (SUTURE) ×2 IMPLANT
SUT VIC AB 1 CT1 36 (SUTURE) ×4 IMPLANT
SUT VICRYL+ 3-0 36IN CT-1 (SUTURE) ×4 IMPLANT
TAPE PAPER 3X10 WHT MICROPORE (GAUZE/BANDAGES/DRESSINGS) ×2 IMPLANT
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
## 2020-12-27 NOTE — Progress Notes (Signed)
Labor Progress Note  Brianna Lloyd is a 20 y.o. G1P0 at [redacted]w[redacted]d by LMP admitted for Preeclampsia without severe features.   Subjective: to room for FHR bradycardia  Objective: BP 128/72   Pulse 71   Temp 98.5 F (36.9 C) (Oral)   Resp 18   Ht 5' (1.524 m)   Wt 68 kg   LMP 04/12/2020   BMI 29.29 kg/m  Notable VS details: reviewed  Fetal Assessment: FHT:  FHR bradycardia to 80bpm, intrauterine resuscitation measures in process.  No UCs. Given terb.   SVE:   deferred Membrane status: intact   Labs: Lab Results  Component Value Date   WBC 7.0 12/27/2020   HGB 8.8 (L) 12/27/2020   HCT 28.3 (L) 12/27/2020   MCV 87.1 12/27/2020   PLT 251 12/27/2020    Assessment / Plan: FHR bradycardia  Preeclampsia without severe features.   - notified Dr Logan Bores of FHR bradycardia and interventions.   Prudencio Pair McVey, CNM 12/27/2020, 7:12 AM
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
## 2020-12-27 NOTE — Op Note (Signed)
      OP NOTE  Date: 12/27/2020   10:54 AM Name Emmett Arntz MR# [id number]  Preoperative Diagnosis: 1. Intrauterine pregnancy at [redacted]w[redacted]d Active Problems:   Preeclampsia, third trimester  2.  Possible HSV prodrome 3.  Cat 2 FHR tracing 4.  Pre-E without severe features 5.  Pt desires elective primary CD  Postoperative Diagnosis: 1. Intrauterine pregnancy at [redacted]w[redacted]d, delivered 2. Viable infant 3. Remainder same as pre-op   Procedure: 1. Primary Low-Transverse Cesarean Section  Surgeon: Elonda Husky, MD  Assistant:  Beckey Downing, CNM  No other capable assistant was available for this surgery which requires an experienced, high level assistant.  She provided exposure, dissection, suctioning, retraction, and general support and assistance during the procedure.   Anesthesia: Spinal    EBL:    Findings: 1) female infant, Apgar scores of 8   at 1 minute and 8   at 5 minutes and a birthweight of 75.49  ounces.    2) Normal uterus, tubes and ovaries.    Procedure:  The patient was prepped and draped in the supine position and placed under spinal anesthesia.  A transverse incision was made across the abdomen in a Pfannenstiel manner. If indicated the old scar was systematically removed with sharp dissection.  We carried the dissection down to the level of the fascia.  The fascia was incised in a curvilinear manner.  The fascia was then elevated from the rectus muscles with blunt and sharp dissection.  The rectus muscles were separated laterally exposing the peritoneum.  The peritoneum was carefully entered with care being taken to avoid bowel and bladder.  A self-retaining retractor was placed.  The visceral peritoneum was incised in a curvilinear fashion across the lower uterine segment creating a bladder flap. A transverse incision was made across the lower uterine segment and extended laterally and superiorly using the bandage scissors.  Artificial rupture membranes was performed  and Clear fluid was noted.  The infant was delivered from the cephalic position.  A nuchal cord was not present. After an appropriate time interval, the cord was doubly clamped and cut. Cord blood was obtained if required.  The infant was handed to the pediatric personnel  who then placed the infant under heat lamps where it was cleaned dried and suctioned as needed. The placenta was delivered. The hysterotomy incision was then identified on ring forceps.  The uterine cavity was cleaned with a moist lap sponge.  The hysterotomy incision was closed with a running interlocking suture of Vicryl.  Hemostasis was excellent.  Pitocin was run in the IV and the uterus was found to be firm. The posterior cul-de-sac and gutters were cleaned and inspected.  Hemostasis was noted.  The fascia was then closed with a running suture of #1 Vicryl.  Hemostasis of the subcutaneous tissues was obtained using the Bovie.  The subcutaneous tissues were closed with a running suture of 000 Vicryl.  A subcuticular suture was placed.  Dermabond was applied in the usual manner.  A pressure dressing was placed.  The patient went to the recovery room in stable condition.   Elonda Husky, M.D. 12/27/2020 10:54 AM

## 2020-12-27 NOTE — Transfer of Care (Signed)
Immediate Anesthesia Transfer of Care Note  Patient: Brianna Lloyd  Procedure(s) Performed: CESAREAN SECTION  Patient Location: PACU L&D  Anesthesia Type:Spinal  Level of Consciousness: awake, alert  and oriented  Airway & Oxygen Therapy: Patient Spontanous Breathing  Post-op Assessment: Report given to RN and Post -op Vital signs reviewed and stable  Post vital signs: Reviewed and stable  Last Vitals:  Vitals Value Taken Time  BP 116/54 12/27/20 1106  Temp    Pulse 64 12/27/20 1106  Resp 15 12/27/20 1106  SpO2 98 % 12/27/20 1106  Vitals shown include unvalidated device data.  Last Pain:  Vitals:   12/27/20 1106  TempSrc:   PainSc: 0-No pain      Patients Stated Pain Goal: 0 (12/27/20 0719)  Complications: No complications documented.

## 2020-12-27 NOTE — Anesthesia Procedure Notes (Signed)
Spinal  Patient location during procedure: OR Start time: 12/27/2020 10:00 AM End time: 12/27/2020 10:06 AM Staffing Performed: resident/CRNA  Anesthesiologist: Emmie Niemann, MD Resident/CRNA: Hedda Slade, CRNA Preanesthetic Checklist Completed: patient identified, IV checked, site marked, risks and benefits discussed, surgical consent, monitors and equipment checked, pre-op evaluation and timeout performed Spinal Block Patient position: sitting Prep: ChloraPrep Patient monitoring: heart rate, continuous pulse ox, blood pressure and cardiac monitor Approach: midline Location: L3-4 Injection technique: single-shot Needle Needle type: Whitacre and Introducer  Needle gauge: 24 G Needle length: 9 cm Assessment Sensory level: T4 Additional Notes Negative paresthesia. Negative blood return. Positive free-flowing CSF. Expiration date of kit checked and confirmed. Patient tolerated procedure well, without complications.

## 2020-12-27 NOTE — Progress Notes (Signed)
LABOR NOTE   Brianna Lloyd 19 y.o.@ at [redacted]w[redacted]d  SUBJECTIVE:  Pt with h/o HSV on prohylaxsis admitted for scheduled induction for Pre-E without severe features (13% growth) but is insistent that she is experiencing prodromal HSV symptoms.   Pt specifically and repeatedly requesting primary CD. R/B of CD discussed with option of delayed induction to further identify HSV if present.  Pt declined. Multiple attempts to re-assure pt regaridng HSV and Pre-E and work toward possible vaginal birth.  Pt not interested. "I'm going to have a cesarean delivery today."  Will plan for primary CD.  OBJECTIVE:  BP 138/76 (BP Location: Left Arm)   Pulse 78   Temp 98.5 F (36.9 C) (Oral)   Resp 16   Ht 5' (1.524 m)   Wt 68 kg   LMP 04/12/2020   SpO2 100%   BMI 29.29 kg/m  No intake/output data recorded.  She has not shown cervical change. CERVIX: not evaluated SVE:     CONTRACTIONS: none FHR: Fetal heart tracing reviewed. 10 minute decel noted this morning.  Baby recovered with position changes. Category II    Labs: Lab Results  Component Value Date   WBC 7.0 12/27/2020   HGB 8.8 (L) 12/27/2020   HCT 28.3 (L) 12/27/2020   MCV 87.1 12/27/2020   PLT 251 12/27/2020    ASSESSMENT: 1) Labor curve reviewed.       Progress: Not in labor.     Membranes: intact           2) H/O HSV - pt insists she is having prodrome despite HSV prophylaxis  3) Cat II tracing this AM 4) Pre-E without severe features 5) 13% growth 6) Pt insistent upon primary CD  Active Problems:   Preeclampsia, third trimester   PLAN: Primary CD   Elonda Husky, M.D. 12/27/2020 9:38 AM

## 2020-12-28 MED ORDER — NIFEDIPINE ER OSMOTIC RELEASE 30 MG PO TB24
30.0000 mg | ORAL_TABLET | Freq: Every day | ORAL | Status: DC
  Administered 2020-12-28 – 2020-12-29 (×2): 30 mg via ORAL
  Filled 2020-12-28 (×2): qty 1

## 2020-12-28 NOTE — Progress Notes (Signed)
Progress Note - Cesarean Delivery  Brianna Lloyd is a 20 y.o. G1P1001 now PP day 1 s/p C-Section, Low Transverse .   Subjective:  Patient reports no problems with eating, bowel movements, voiding, or their wound  No c/o HSV  Pain controlled  Objective:  Vital signs in last 24 hours: Temp:  [97.4 F (36.3 C)-98.5 F (36.9 C)] 98.5 F (36.9 C) (03/12 0726) Pulse Rate:  [51-81] 65 (03/12 0726) Resp:  [9-25] 18 (03/12 0726) BP: (91-167)/(54-143) 145/84 (03/12 0726) SpO2:  [95 %-100 %] 99 % (03/12 0726)  Physical Exam:  General: alert, cooperative and no distress Lochia: appropriate Uterine Fundus: firm Incision: dressing intact    Data Review Recent Labs    12/27/20 0149  HGB 8.8*  HCT 28.3*    Assessment:  Active Problems:   Preeclampsia, third trimester   Status post Cesarean section. Doing well postoperatively.     Plan:       Continue current care.  OOB - may shower  Remove dressing  PO Pain meds  Elonda Husky, M.D. 12/28/2020 9:20 AM

## 2020-12-28 NOTE — Progress Notes (Signed)
Post Partum Day 1  Subjective: Doing well, no concerns. Ambulating without difficulty, pain managed with PO meds, tolerating regular diet, and voiding without difficulty.   No fever/chills, chest pain, shortness of breath, nausea/vomiting, or leg pain. No nipple or breast pain. No headache, visual changes, or RUQ/epigastric pain.  Objective: BP (!) 145/84 (BP Location: Right Arm)   Pulse 65   Temp 98.5 F (36.9 C) (Oral)   Resp 18   Ht 5' (1.524 m)   Wt 68 kg   LMP 04/12/2020   SpO2 99%   Breastfeeding Unknown   BMI 29.29 kg/m    Physical Exam:  General: alert and cooperative Breasts: soft/nontender CV: RRR Pulm: nl effort Abdomen: soft, non-tender Uterine Fundus: firm Incision: dressing c/d/i Perineum: intact Lochia: appropriate DVT Evaluation: No evidence of DVT seen on physical exam. Edinburgh:  Edinburgh Postnatal Depression Scale Screening Tool 12/28/2020 12/27/2020 06/22/2020  I have been able to laugh and see the funny side of things. 0 (No Data) 2  I have looked forward with enjoyment to things. 0 - 2  I have blamed myself unnecessarily when things went wrong. 0 - 2  I have been anxious or worried for no good reason. 0 - 3  I have felt scared or panicky for no good reason. 0 - 3  Things have been getting on top of me. 0 - 3  I have been so unhappy that I have had difficulty sleeping. 0 - 2  I have felt sad or miserable. 0 - 2  I have been so unhappy that I have been crying. 0 - 2  The thought of harming myself has occurred to me. 0 - 1  Edinburgh Postnatal Depression Scale Total 0 - 22     Recent Labs    12/27/20 0149  HGB 8.8*  HCT 28.3*  WBC 7.0  PLT 251    Assessment/Plan: 19 y.o. G1P1001 postpartum day # 1  Blood pressure 12/28/20 07:26:35 145/84Abnormal  12/28/20 04:07:43 145/76Abnormal  12/28/20 00:26:49 121/71  Output   0700-1900  1901-0700 Start Procardia XL 30mg  QD  -Continue routine postpartum care -Immunization  status: Needs Tdap and flu prior to discharge if she excepts  Disposition: Continue inpatient postpartum care    LOS: 1 day   Jan Walters, CNM 12/28/2020, 12:44 PM

## 2020-12-28 NOTE — Anesthesia Post-op Follow-up Note (Signed)
°  Anesthesia Pain Follow-up Note  Patient: Brianna Lloyd  Day #: 1  Date of Follow-up: 12/28/2020 Time: 11:54 AM  Last Vitals:  Vitals:   12/28/20 0407 12/28/20 0726  BP: (!) 145/76 (!) 145/84  Pulse: 66 65  Resp: 16 18  Temp: 36.9 C 36.9 C  SpO2: 98% 99%    Level of Consciousness: alert  Pain: mild   Side Effects:None  Catheter Site Exam:clean     Plan: D/C from anesthesia care at surgeon's request  Shaniyah Wix K

## 2020-12-28 NOTE — Anesthesia Postprocedure Evaluation (Signed)
Anesthesia Post Note  Patient: Optometrist  Procedure(s) Performed: CESAREAN SECTION  Patient location during evaluation: Mother Baby Anesthesia Type: Spinal Level of consciousness: oriented and awake and alert Pain management: pain level controlled Vital Signs Assessment: post-procedure vital signs reviewed and stable Respiratory status: spontaneous breathing and respiratory function stable Cardiovascular status: stable Postop Assessment: no headache, no backache, no apparent nausea or vomiting and able to ambulate Anesthetic complications: no   No complications documented.   Last Vitals:  Vitals:   12/28/20 0407 12/28/20 0726  BP: (!) 145/76 (!) 145/84  Pulse: 66 65  Resp: 16 18  Temp: 36.9 C 36.9 C  SpO2: 98% 99%    Last Pain:  Vitals:   12/28/20 1000  TempSrc:   PainSc: 1                  KEPHART,WILLIAM K

## 2020-12-29 DIAGNOSIS — D649 Anemia, unspecified: Secondary | ICD-10-CM | POA: Diagnosis present

## 2020-12-29 LAB — CBC WITH DIFFERENTIAL/PLATELET
Abs Immature Granulocytes: 0.09 10*3/uL — ABNORMAL HIGH (ref 0.00–0.07)
Basophils Absolute: 0 10*3/uL (ref 0.0–0.1)
Basophils Relative: 0 %
Eosinophils Absolute: 0.1 10*3/uL (ref 0.0–0.5)
Eosinophils Relative: 1 %
HCT: 26.9 % — ABNORMAL LOW (ref 36.0–46.0)
Hemoglobin: 8.5 g/dL — ABNORMAL LOW (ref 12.0–15.0)
Immature Granulocytes: 1 %
Lymphocytes Relative: 28 %
Lymphs Abs: 2.8 10*3/uL (ref 0.7–4.0)
MCH: 27.9 pg (ref 26.0–34.0)
MCHC: 31.6 g/dL (ref 30.0–36.0)
MCV: 88.2 fL (ref 80.0–100.0)
Monocytes Absolute: 0.6 10*3/uL (ref 0.1–1.0)
Monocytes Relative: 6 %
Neutro Abs: 6.3 10*3/uL (ref 1.7–7.7)
Neutrophils Relative %: 64 %
Platelets: 269 10*3/uL (ref 150–400)
RBC: 3.05 MIL/uL — ABNORMAL LOW (ref 3.87–5.11)
RDW: 16.1 % — ABNORMAL HIGH (ref 11.5–15.5)
WBC: 9.9 10*3/uL (ref 4.0–10.5)
nRBC: 0.3 % — ABNORMAL HIGH (ref 0.0–0.2)

## 2020-12-29 MED ORDER — NIFEDIPINE ER OSMOTIC RELEASE 30 MG PO TB24: 60.0000 mg | ORAL_TABLET | Freq: Every day | ORAL | Status: DC

## 2020-12-29 MED ORDER — PANTOPRAZOLE SODIUM 40 MG PO TBEC: 40.0000 mg | DELAYED_RELEASE_TABLET | Freq: Every day | ORAL | 1 refills | Status: DC

## 2020-12-29 MED ORDER — PANTOPRAZOLE SODIUM 40 MG PO TBEC
40.0000 mg | DELAYED_RELEASE_TABLET | Freq: Every day | ORAL | Status: DC
  Administered 2020-12-29: 40 mg via ORAL
  Filled 2020-12-29: qty 1

## 2020-12-29 MED ORDER — OXYCODONE HCL 5 MG PO TABS: 5.0000 mg | ORAL_TABLET | ORAL | 0 refills | Status: DC | PRN

## 2020-12-29 MED ORDER — NIFEDIPINE ER 60 MG PO TB24: 60.0000 mg | ORAL_TABLET | Freq: Every day | ORAL | 11 refills | Status: DC

## 2020-12-29 MED ORDER — CALCIUM CARBONATE ANTACID 500 MG PO CHEW
1.0000 | CHEWABLE_TABLET | Freq: Three times a day (TID) | ORAL | Status: DC | PRN
  Administered 2020-12-29: 200 mg via ORAL
  Filled 2020-12-29: qty 1

## 2020-12-29 NOTE — Clinical Social Work Maternal (Signed)
CLINICAL SOCIAL WORK MATERNAL/CHILD NOTE  Patient Details  Name: Brianna Brianna MRN: [id number] Date of Birth: 2001-10-02  Date:  12/29/2020  Clinical Social Worker Initiating Note:  History of mental health issues and spychosocial issues. Date/Time: Initiated:  12/29/20/1143     Child's Name:  Brianna Brianna   Biological Parents:  Mother,Father Barrett Henle)   Need for Interpreter:  None   Reason for Referral:  Behavioral Health Concerns   Address:  [address] [place] [address]    Phone number:  [phone] (home)     Additional phone number: none  Household Members/Support Persons (HM/SP):   Household Member/Support Person 1   HM/SP Name Relationship DOB or Age  HM/SP -1        HM/SP -2        HM/SP -3        HM/SP -4        HM/SP -5        HM/SP -6        HM/SP -7        HM/SP -8          Natural Supports (not living in the home):  Community,Extended Family,Spouse/significant other   Professional Supports:     Employment:     Type of Work:     Education:  Engineer, agricultural   Homebound arranged:    Surveyor, quantity Resources:  Medicaid   Other Resources:  [facility]   Cultural/Religious Considerations Which May Impact Care:  none identified.   Strengths:  Ability to meet basic needs ,Compliance with medical plan ,Home prepared for child ,Pediatrician chosen   Psychotropic Medications:         Pediatrician:       Pediatrician List:   [facility]      Pediatrician Fax Number:    Risk Factors/Current Problems:  Mental Health Concerns    Cognitive State:  Goal Oriented    Mood/Affect:      CSW Assessment: Telephone consult with patient. SW explain HIPPA. Social worker explained to the patient reason for the consult: History of homelessness, foster care system, and marijuana use. Patient is a 20 year old female who gave  birth to a baby girl on 3 /08/2021. Patient reported that she is doing well after the birth of her first child.  She reported that she struggles with depress symptoms but feels okay today.  Noted that she received outpatient therapy in the past.  Denied current medication therapy. Patient denied SI/HI/DV. SW explained the dangers of substance use (marijuana) and shared county resources.   Patient reported that her home is prepared for her baby and has [place] approved rear-facing car seat for proper transport of her newborn. Stated that this is first child and newborn will be living with her and the father of her newborn. Patient reported that her older sibling will be "coming back and forth to help me with the baby." Patient noted that she has good support.   Psychoeducation about Postpartum depression. SW explained to the patient that feelings or thoughts of self-harm and harming her baby, extreme fear about baby's wellbeing, not accepting help from support, not wanting to engage in activities or leave the home, not wanting to care for baby, or feeling you are the only one who can care for baby are all symptoms of postpartum depression.  SW encouraged mother of baby to asked for help/purport by consulting her primary care provider and or talking to her supports about what is happening.  Postpartum: first 2 weeks after delivery emotions may be up and down due hormones rebalancing. If symptoms occur after that 2 week it could be PPD Patient reported that her home is set up for her newborn.   CSW Plan/Description:  No Further Intervention Required/No Barriers to Discharge    Larwance Rote, LCSW 12/29/2020, 11:52 AM

## 2020-12-29 NOTE — Discharge Instructions (Signed)
Discharge Instructions:   Follow-up Appointment: Call ASAP and schedule a follow-up appointment for a visit in 2 days for a blood pressure check!   If there are any new medications, they have been ordered and will be available for pickup at the listed pharmacy on your way home from the hospital.   Call office if you have any of the following: headache, visual changes, fever >101.0 F, chills, shortness of breath, breast concerns, excessive vaginal bleeding, incision drainage or problems, leg pain or redness, depression or any other concerns. If you have vaginal discharge with an odor, let your doctor know.   It is normal to bleed for up to 6 weeks. You should not soak through more than 1 pad in 1 hour. If you have a blood clot larger than your fist with continued bleeding, call your doctor.   After a c-section, you should expect a small amount of blood or clear fluid coming from the incision and abdominal cramping/soreness. Inspect your incision site daily. Stand in front of a mirror to look for any redness, incision opening, or discolored/odorness drainage. Take a shower daily and continue good hygiene. Use own towel and washcloth (do not share). Make sure your sheets on your bed are clean. No pets sleeping around your incision site. Dressing will be removed at your postpartum visit. If the dressing does become wet or soiled underneath, it is okay to remove it.   Activity: Do not lift > 10 lbs for 6 weeks (do not lift anything heavier than your baby). No intercourse, tampons, swimming pools, hot tubs, baths (only showers) for 6 weeks.  No driving for 1-2 weeks. Continue prenatal vitamin, especially if breastfeeding. Increase calories and fluids (water) while breastfeeding.   Your milk will come in, in the next couple of days (right now it is colostrum). You may have a slight fever when your milk comes in, but it should go away on its own.  If it does not, and rises above 101 F please call the  doctor. You will also feel achy and your breasts will be firm. They will also start to leak. If you are breastfeeding, continue as you have been and you can pump/express milk for comfort.   If you have too much milk, your breasts can become engorged, which could lead to mastitis. This is an infection of the milk ducts. It can be very painful and you will need to notify your doctor to obtain a prescription for antibiotics. You can also treat it with a shower or hot/cold compress.   For concerns about your baby, please call your pediatrician.  For breastfeeding concerns, the lactation consultant can be reached at [phone].   Postpartum blues (feelings of happy one minute and sad another minute) are normal for the first few weeks but if it gets worse let your doctor know.   Congratulations! We enjoyed caring for you and your new bundle of joy!

## 2020-12-29 NOTE — Consult Note (Signed)
Brief Pharmacy Note  Pharmacy has been consulted for our anti-hypertensive meds to beds program.   The patient's preferred pharmacy in Epic has NOT been changed to [facility] employee pharmacy because it is a weekend discharge.  Please re-consult pharmacy if patient discharge is delayed until Monday.  This has been communicated to floor nurse [place].  Thank you for including pharmacy in this patient's care.  Albina Billet, Clinical Pharmacist 12/29/2020 11:11 AM

## 2020-12-29 NOTE — Progress Notes (Signed)
Patient discharged home with infant. FOB present at discharge. Discharge instructions and prescriptions given and reviewed with patient. Patient verbalized understanding.   Explained importance of calling and scheduling follow-up blood pressure check for an appointment in 2-days. Will pickup blood pressure meds on the way home.  C-section care went over in detail with patient. Verbalized understanding.   Social work has cleared the patient for discharge.  Escorted out by volunteers.
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
## 2020-12-30 ENCOUNTER — Encounter: Payer: Self-pay | Admitting: Obstetrics and Gynecology

## 2021-06-15 ENCOUNTER — Emergency Department: Payer: Medicaid Other

## 2021-06-15 ENCOUNTER — Other Ambulatory Visit: Payer: Self-pay

## 2021-06-15 ENCOUNTER — Emergency Department
Admission: EM | Admit: 2021-06-15 | Discharge: 2021-06-15 | Disposition: A | Discharge status: Home / Self Care | Payer: Medicaid Other | Attending: Emergency Medicine | Admitting: Emergency Medicine

## 2021-06-15 DIAGNOSIS — Z3A01 Less than 8 weeks gestation of pregnancy: Secondary | ICD-10-CM | POA: Insufficient documentation

## 2021-06-15 DIAGNOSIS — N9489 Other specified conditions associated with female genital organs and menstrual cycle: Secondary | ICD-10-CM | POA: Diagnosis not present

## 2021-06-15 DIAGNOSIS — O9A211 Injury, poisoning and certain other consequences of external causes complicating pregnancy, first trimester: Secondary | ICD-10-CM | POA: Diagnosis not present

## 2021-06-15 DIAGNOSIS — S3991XA Unspecified injury of abdomen, initial encounter: Secondary | ICD-10-CM | POA: Diagnosis not present

## 2021-06-15 LAB — CBC WITH DIFFERENTIAL/PLATELET
Abs Immature Granulocytes: 0.01 10*3/uL (ref 0.00–0.07)
Basophils Absolute: 0 10*3/uL (ref 0.0–0.1)
Basophils Relative: 0 %
Eosinophils Absolute: 0.1 10*3/uL (ref 0.0–0.5)
Eosinophils Relative: 1 %
HCT: 35.4 % — ABNORMAL LOW (ref 36.0–46.0)
Hemoglobin: 11.5 g/dL — ABNORMAL LOW (ref 12.0–15.0)
Immature Granulocytes: 0 %
Lymphocytes Relative: 28 %
Lymphs Abs: 1.6 10*3/uL (ref 0.7–4.0)
MCH: 29.3 pg (ref 26.0–34.0)
MCHC: 32.5 g/dL (ref 30.0–36.0)
MCV: 90.1 fL (ref 80.0–100.0)
Monocytes Absolute: 0.4 10*3/uL (ref 0.1–1.0)
Monocytes Relative: 7 %
Neutro Abs: 3.7 10*3/uL (ref 1.7–7.7)
Neutrophils Relative %: 64 %
Platelets: 348 10*3/uL (ref 150–400)
RBC: 3.93 MIL/uL (ref 3.87–5.11)
RDW: 13.2 % (ref 11.5–15.5)
WBC: 5.8 10*3/uL (ref 4.0–10.5)
nRBC: 0 % (ref 0.0–0.2)

## 2021-06-15 LAB — COMPREHENSIVE METABOLIC PANEL
ALT: 17 U/L (ref 0–44)
AST: 19 U/L (ref 15–41)
Albumin: 4 g/dL (ref 3.5–5.0)
Alkaline Phosphatase: 70 U/L (ref 38–126)
Anion gap: 8 (ref 5–15)
BUN: 6 mg/dL (ref 6–20)
CO2: 23 mmol/L (ref 22–32)
Calcium: 9.2 mg/dL (ref 8.9–10.3)
Chloride: 104 mmol/L (ref 98–111)
Creatinine, Ser: 0.54 mg/dL (ref 0.44–1.00)
GFR, Estimated: >60 mL/min (ref >60)
Glucose, Bld: 87 mg/dL (ref 70–99)
Potassium: 3.6 mmol/L (ref 3.5–5.1)
Sodium: 135 mmol/L (ref 135–145)
Total Bilirubin: 0.5 mg/dL (ref 0.3–1.2)
Total Protein: 7.6 g/dL (ref 6.5–8.1)

## 2021-06-15 LAB — ABO/RH: ABO/RH(D): B POS

## 2021-06-15 LAB — HCG, QUANTITATIVE, PREGNANCY: hCG, Beta Chain, Quant, S: 24437 m[IU]/mL — ABNORMAL HIGH (ref <5)
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
## 2021-06-15 MED ORDER — SODIUM CHLORIDE 0.9 % IV BOLUS
1000.0000 mL | Freq: Once | INTRAVENOUS | Status: AC
  Administered 2021-06-15: 1000 mL via INTRAVENOUS

## 2021-06-15 MED ORDER — IOHEXOL 350 MG/ML SOLN
75.0000 mL | Freq: Once | INTRAVENOUS | Status: AC | PRN
  Administered 2021-06-15: 75 mL via INTRAVENOUS
  Filled 2021-06-15: qty 75

## 2021-06-15 MED ORDER — ACETAMINOPHEN 500 MG PO TABS
1000.0000 mg | ORAL_TABLET | Freq: Once | ORAL | Status: AC
  Administered 2021-06-15: 1000 mg via ORAL
  Filled 2021-06-15: qty 2

## 2021-06-15 MED ORDER — LIDOCAINE 5 % EX PTCH
1.0000 | MEDICATED_PATCH | CUTANEOUS | Status: DC
  Administered 2021-06-15: 1 via TRANSDERMAL
  Filled 2021-06-15: qty 1
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
## 2021-06-15 NOTE — ED Notes (Signed)
US at bedside
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
## 2021-06-15 NOTE — Discharge Instructions (Addendum)
1. Single live intrauterine pregnancy with a gestational age by  ultrasound of 5 weeks and 6 days which is non-concordant with  gestational age of [redacted] weeks and 6 days by last menstrual period.  Recommend trending of beta quantitative HCG and follow-up ultrasound  in 7-14 days to re-evaluate.  2. Small subchorionic hemorrhage.  3. Trace simple free fluid within the pelvis        Interpersonal Violence   Interpersonal Violence aka Domestic Violence is defined as violence between people who have had a personal relationship. For example, someone you have ever dated, been married to or in a domestic partnership with. Someone with whom you have a child in common, or a current  household member.  Does one or more of the following  attempts to cause bodily injury, or intentionally causes bodily injury; places you or a member of your family or household in fear of imminent serious bodily injury; continued harassment that rises to such a level as to inflict substantial emotional distress; or commits any rape or sexual offense  You are not alone. Unfortunately domestic violence is very common. Domestic violence does not go away on its own and tends to get worse over time and more frequent. There are people who can help. There are resources included in these instructions. Evidence can be collected in case you want to notify law enforcement now or in the future. A forensic nurse can take photographs and create a medical/legal document of the incident. If you choose to report to law enforcement, they will request a copy of the chart which we can provide with your permission. We can call in social work or an advocate to help with safety planning and emergency placement in a shelter if you have no other safe options.  THE POLICE CAN HELP YOU:  Get to a safe place away from the violence.  Get information on how the court can help protect you against the violence.  Get necessary belongings from your home for  you and your children.  Get copies of police reports about the violence.  File a complaint in criminal court.  Find where local criminal and family courts are located.  [facility] Can Help You Safety Planning Assistance with Shelter Obtaining a Engineer, maintenance (IT) (50B) Research officer, political party Support Group Environmental consultant with domestic violence related criminal charges Child Youth worker Assistance Enrollment Job Readiness Budget Counseling  Coaching and Mentoring  Call your local domestic violence program for additional information and support.   [facility] [place]   336-641-SAFE Crisis Line [phone] [facility] [place]   [phone] Crisis Line [phone] [facility] [phone]  National Domestic Violence Abuse Hotline  650-095-0530    Soft Tissue Injury of the Neck (Strangulation)  A soft tissue injury of the neck is serious and needs medical care right away.  Some injuries do not break the skin (blunt injury).  Some injuries do break the skin (penetrating injury) and create an open wound.  You may feel fine at first, but the puffiness (swelling) in your throat can slowly make it harder to breathe.  This could cause serious or life-threatening injury.  There could be damage to major blood vessels and nerves in the neck.    Be sure to tell your health care provider how the injury occurred and if someone else caused the injury.  Also, tell the provider if any object or hands  were used to cause the injury (such as rope, clothesline, telephone cord, etc).    Home Care Get help right away if: Your voice gets weaker or hoarse Your puffiness or bruising does not get better You have new puffiness or bruising in the face or neck Your pain gets worse  You have trouble swallowing You cough up blood You have trouble breathing You start to  drool You start throwing up (vomiting) You have a fever of 102 degrees  Adapted from [facility] Patient Information

## 2021-06-15 NOTE — ED Notes (Signed)
Patient transported to CT 
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
## 2021-06-16 NOTE — SANE Note (Signed)
  Follow-up Phone Call  Patient gives verbal consent for a FNE/SANE follow-up phone call in 48-72 hours: NO Patient's telephone number: CELL [phone] Patient gives verbal consent to leave voicemail at the phone number listed above: NO DO NOT CALL between the hours of: DID NOT ASK

## 2021-06-16 NOTE — SANE Note (Signed)
At approximately 11:45 pm on 06/15/2021 The SANE/FNE Teacher, music) consult has been completed. The primary RN Herbert Seta, and the provider have been notified. Please contact the SANE/FNE nurse on call (listed in Amion) with any further concerns.

## 2021-06-29 ENCOUNTER — Encounter: Payer: Self-pay | Admitting: Emergency Medicine

## 2021-06-29 ENCOUNTER — Other Ambulatory Visit: Payer: Self-pay

## 2021-06-29 ENCOUNTER — Emergency Department
Admission: EM | Admit: 2021-06-29 | Discharge: 2021-06-29 | Disposition: A | Discharge status: Home / Self Care | Payer: Medicaid Other | Attending: Emergency Medicine | Admitting: Emergency Medicine

## 2021-06-29 ENCOUNTER — Emergency Department: Payer: Medicaid Other

## 2021-06-29 DIAGNOSIS — O219 Vomiting of pregnancy, unspecified: Secondary | ICD-10-CM | POA: Insufficient documentation

## 2021-06-29 DIAGNOSIS — Z3A08 8 weeks gestation of pregnancy: Secondary | ICD-10-CM | POA: Insufficient documentation

## 2021-06-29 DIAGNOSIS — O1493 Unspecified pre-eclampsia, third trimester: Secondary | ICD-10-CM | POA: Diagnosis not present

## 2021-06-29 DIAGNOSIS — R1030 Lower abdominal pain, unspecified: Secondary | ICD-10-CM

## 2021-06-29 LAB — CBC
HCT: 36.4 % (ref 36.0–46.0)
Hemoglobin: 12.3 g/dL (ref 12.0–15.0)
MCH: 30.4 pg (ref 26.0–34.0)
MCHC: 33.8 g/dL (ref 30.0–36.0)
MCV: 89.9 fL (ref 80.0–100.0)
Platelets: 403 10*3/uL — ABNORMAL HIGH (ref 150–400)
RBC: 4.05 MIL/uL (ref 3.87–5.11)
RDW: 13 % (ref 11.5–15.5)
WBC: 5.1 10*3/uL (ref 4.0–10.5)
nRBC: 0 % (ref 0.0–0.2)

## 2021-06-29 LAB — URINALYSIS, COMPLETE (UACMP) WITH MICROSCOPIC
Bilirubin Urine: NEGATIVE
Glucose, UA: NEGATIVE mg/dL
Ketones, ur: >160 mg/dL — AB
Nitrite: NEGATIVE
Protein, ur: 30 mg/dL — AB
Specific Gravity, Urine: 1.02 (ref 1.005–1.030)
pH: 7 (ref 5.0–8.0)

## 2021-06-29 LAB — COMPREHENSIVE METABOLIC PANEL
ALT: 19 U/L (ref 0–44)
AST: 20 U/L (ref 15–41)
Albumin: 4.8 g/dL (ref 3.5–5.0)
Alkaline Phosphatase: 71 U/L (ref 38–126)
Anion gap: 9 (ref 5–15)
BUN: 7 mg/dL (ref 6–20)
CO2: 24 mmol/L (ref 22–32)
Calcium: 9.8 mg/dL (ref 8.9–10.3)
Chloride: 101 mmol/L (ref 98–111)
Creatinine, Ser: 0.61 mg/dL (ref 0.44–1.00)
GFR, Estimated: >60 mL/min (ref >60)
Glucose, Bld: 87 mg/dL (ref 70–99)
Potassium: 3.8 mmol/L (ref 3.5–5.1)
Sodium: 134 mmol/L — ABNORMAL LOW (ref 135–145)
Total Bilirubin: 0.8 mg/dL (ref 0.3–1.2)
Total Protein: 8.3 g/dL — ABNORMAL HIGH (ref 6.5–8.1)

## 2021-06-29 LAB — LIPASE, BLOOD: Lipase: 34 U/L (ref 11–51)

## 2021-06-29 LAB — HCG, QUANTITATIVE, PREGNANCY: hCG, Beta Chain, Quant, S: 176804 m[IU]/mL — ABNORMAL HIGH (ref <5)

## 2021-06-29 IMAGING — US US OB < 14 WEEKS - US OB TV
1 series · 13 of 28 positions shown · non-contrast
Comparison: [DATE]

CLINICAL DATA: Lower abdominal pain for 2 weeks, worsening for 2
days, beta HCG 176,804

EXAM:
OBSTETRIC <14 WK US AND TRANSVAGINAL OB US
TECHNIQUE: Both transabdominal and transvaginal ultrasound examinations were
performed for complete evaluation of the gestation as well as the
maternal uterus, adnexal regions, and pelvic cul-de-sac.
Transvaginal technique was performed to assess early pregnancy.

[Series 1: us ob comp less 14 wks · 100 acquisitions, 13 frames shown]
[im 4/100]
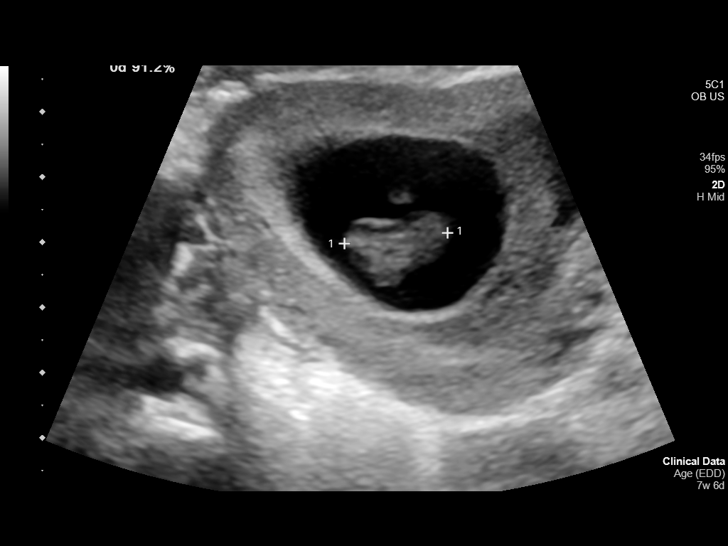
[im 12/100]
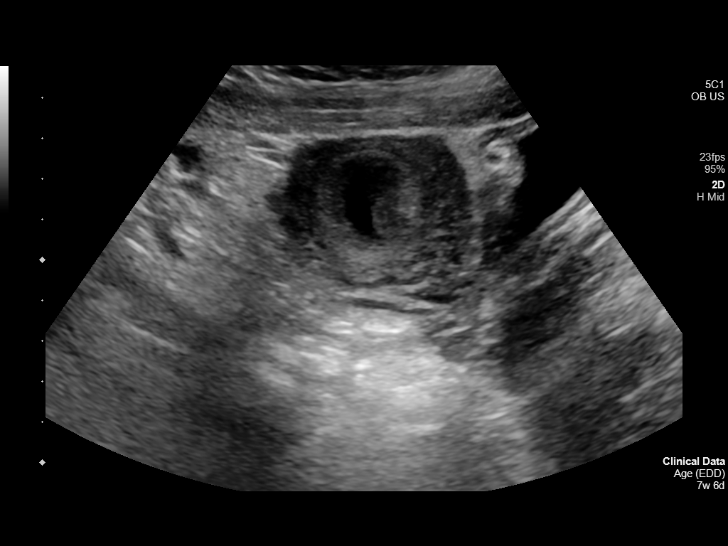
[im 19/100]
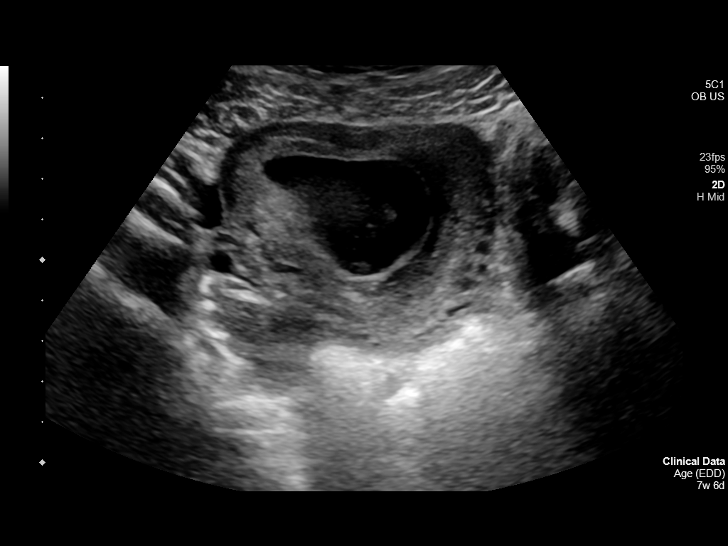
[im 26/100]
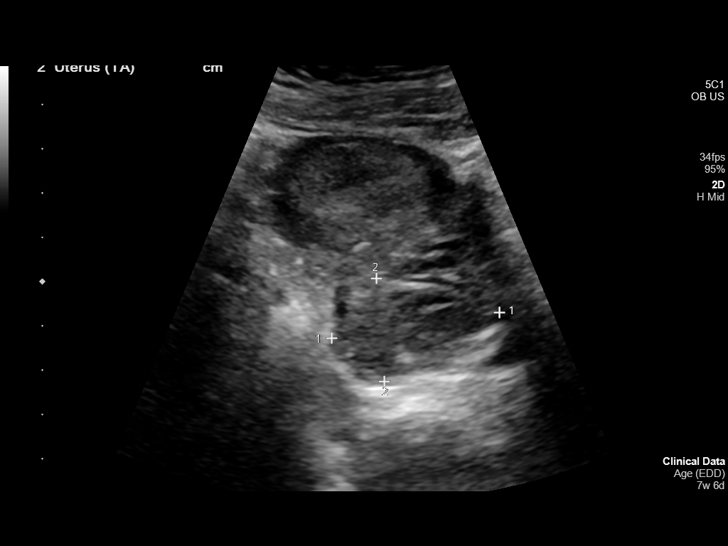
[im 34/100]
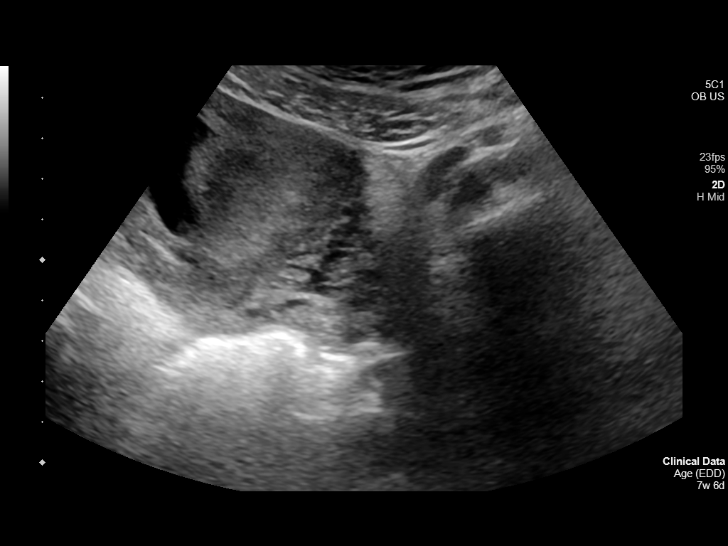
[im 41/100]
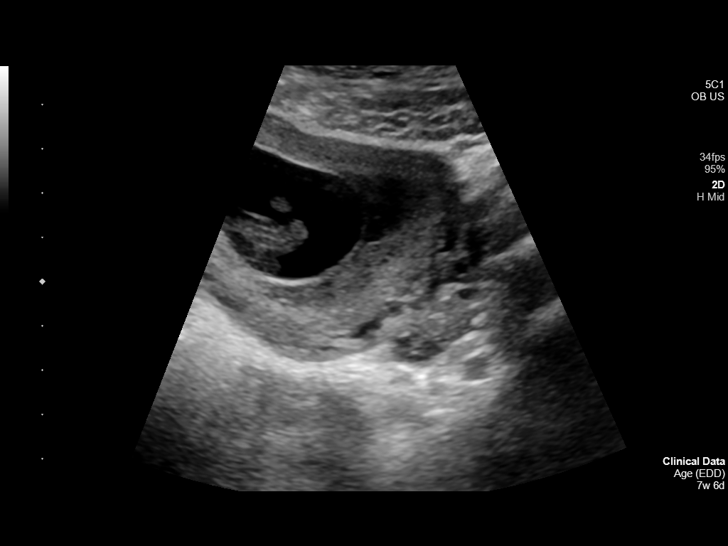
[im 52/100]
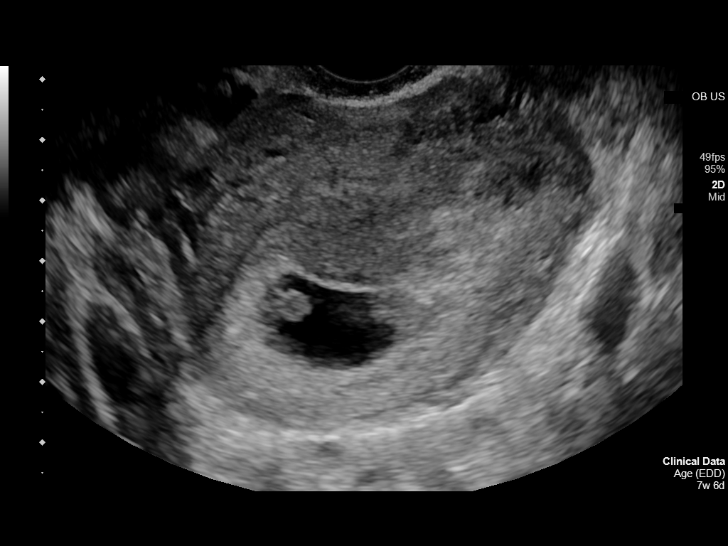
[im 59/100]
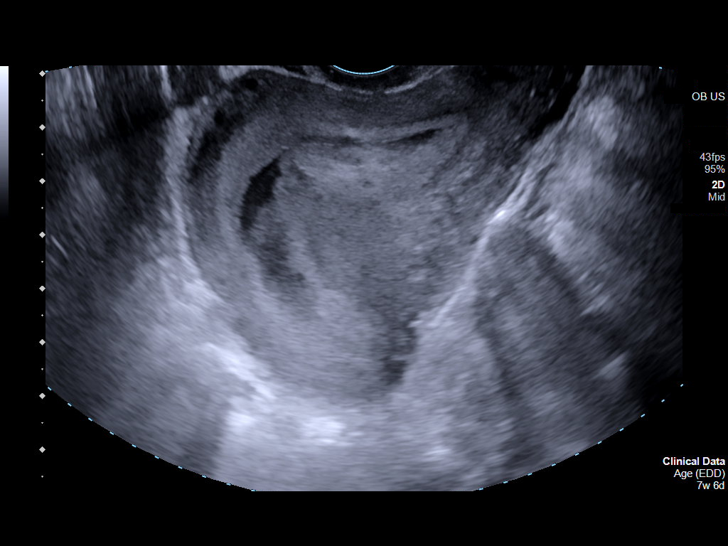
[im 67/100]
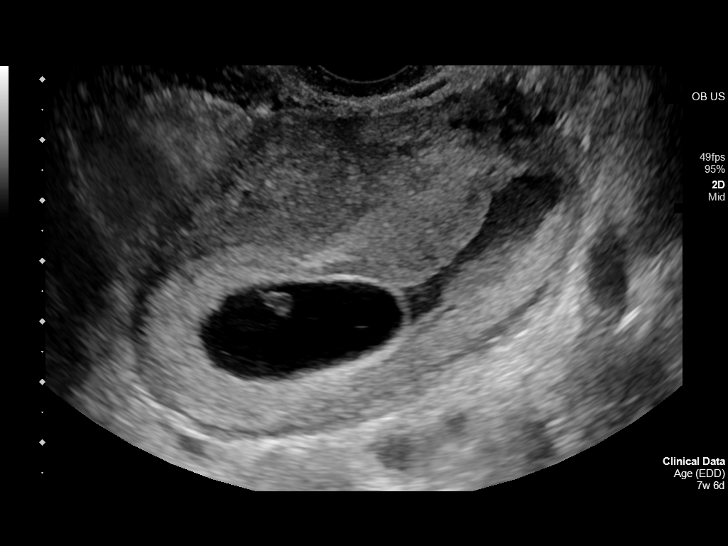
[im 74/100]
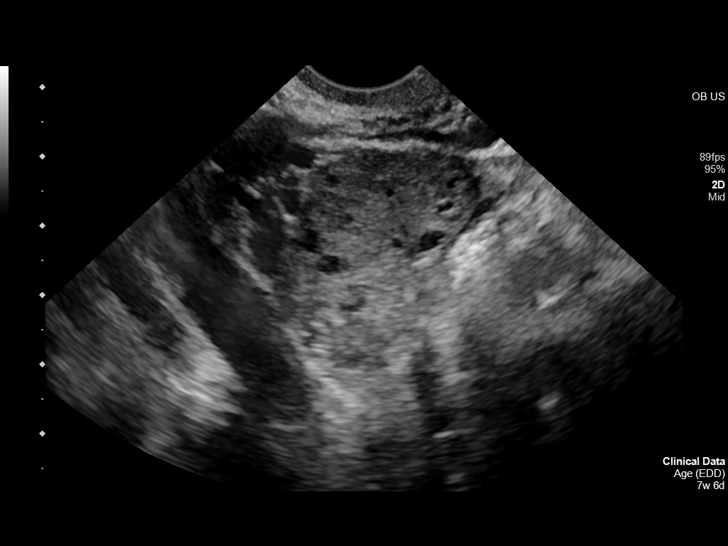
[im 81/100]
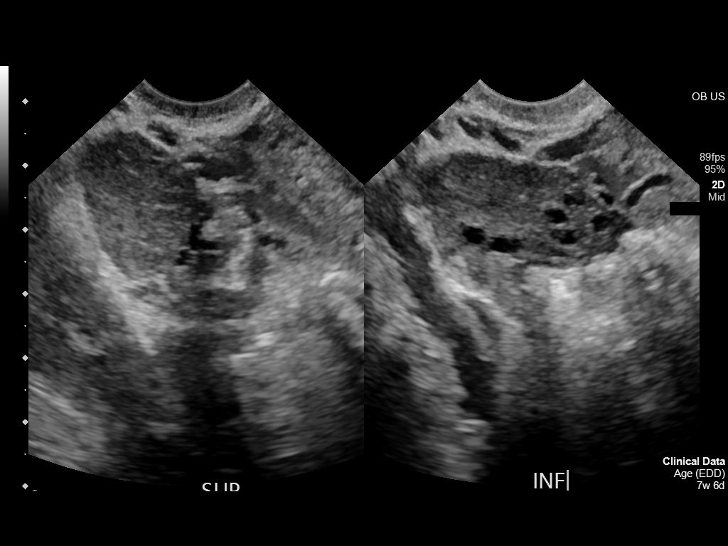
[im 89/100]
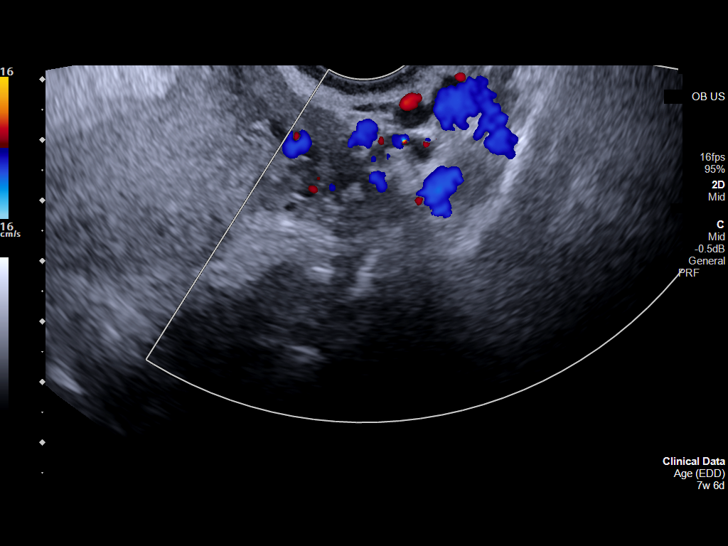
[im 96/100]
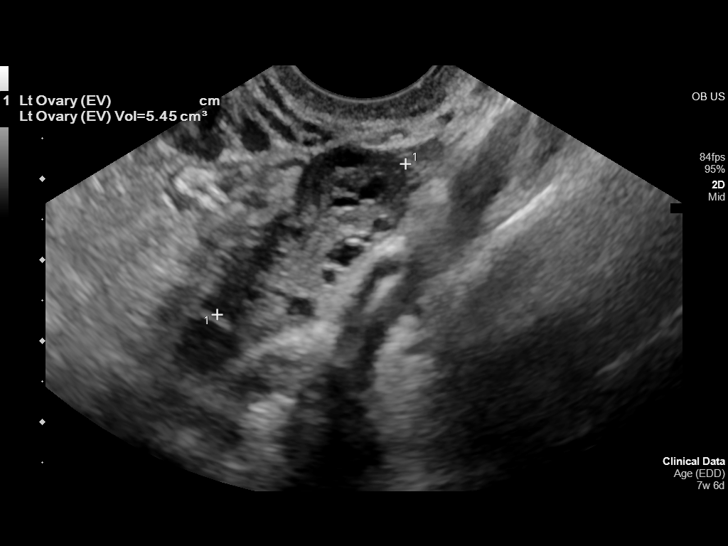

[13 of 28 positions shown; findings below may reference images not displayed]

FINDINGS: Intrauterine gestational sac: Single

Yolk sac:  Visualized.

Embryo:  Visualized.

Cardiac Activity: Visualized.

Heart Rate: 173 bpm

CRL: 16.0 mm   8 w   0 d                  US EDC: [DATE]

Subchorionic hemorrhage: There is a moderate subchorionic hemorrhage
along the left lateral aspect of the gestational sac, measuring up
to 3.4 x 1.0 x 1.8 cm.

Maternal uterus/adnexae: The uterus is anteverted. Cervix appears
closed.

Right ovary measures 3.5 x 2.5 x 3.1 cm and the left ovary measures
2.4 x 1.5 x 1.4 cm. Probable corpus luteum cyst within the right
ovary measuring up to 2.3 cm. No free fluid within the pelvis.
IMPRESSION: 1. Single live intrauterine pregnancy as above, estimated age 8
weeks and 0 days.
2. Moderate subchorionic hemorrhage, measuring 3.4 x 1.0 x 1.8 cm.

## 2021-06-29 MED ORDER — ONDANSETRON HCL 4 MG/2ML IJ SOLN
4.0000 mg | Freq: Once | INTRAMUSCULAR | Status: AC
  Administered 2021-06-29: 4 mg via INTRAVENOUS
  Filled 2021-06-29: qty 2

## 2021-06-29 MED ORDER — SODIUM CHLORIDE 0.9 % IV SOLN
1000.0000 mL | Freq: Once | INTRAVENOUS | Status: AC
  Administered 2021-06-29: 1000 mL via INTRAVENOUS

## 2021-06-29 NOTE — ED Triage Notes (Signed)
Pt to ED via [facility] c/o N&V and lower abdominal pain that started 2 weeks ago and has progressively gotten worse over the last 2 days. Pt states she is around [redacted] weeks pregnant. Pt denies recent fever, and diarrhea  Pt A&Ox4, NAD

## 2021-06-29 NOTE — ED Provider Notes (Signed)
[facility] Emergency Department Provider Note   ____________________________________________    I have reviewed the triage vital signs and the nursing notes.   HISTORY  Chief Complaint Nausea and Emesis     HPI Brianna Lloyd is a 20 y.o. female with history as detailed below who presents at approximately [redacted] weeks gestation with complaints of nausea and vomiting.  Patient reports over the last 2 weeks it has been particularly severe, she reports she has lost weight because of nausea and vomiting.  She reports this happened with her last pregnancy as well.  She has a 27-month-old healthy infant at this time.  Has not take anything for this.  Denies vaginal bleeding.  He is here for IV fluids, had ultrasound at 5 weeks  Past Medical History:  Diagnosis Date   Aggressive behavior of adolescent    Medical history non-contributory     Patient Active Problem List   Diagnosis Date Noted   Cesarean delivery delivered 12/29/2020   Chronic anemia 12/29/2020   Maternal iron deficiency anemia affecting pregnancy in third trimester, antepartum 12/23/2020   Gestational hypertension w/o significant proteinuria in 3rd trimester 12/20/2020   Elevated blood pressure complicating pregnancy, antepartum 12/12/2020   Preeclampsia, third trimester 12/12/2020   Hyperemesis complicating pregnancy, antepartum 06/21/2020   Supervision of high risk pregnancy due to social problems in third trimester 06/09/2020   PTSD (post-traumatic stress disorder) 01/20/2016   ADHD (attention deficit hyperactivity disorder) 04/24/2013   Anxiety 11/05/2011    Past Surgical History:  Procedure Laterality Date   CESAREAN SECTION  12/27/2020   Procedure: CESAREAN SECTION;  Surgeon: Linzie Collin, MD;  Location: [facility] ORS;  Service: Obstetrics;;   NO PAST SURGERIES      Prior to Admission medications   Medication Sig Start Date End Date Taking? Authorizing Provider  acetaminophen  (TYLENOL) 325 MG tablet Take 2 tablets (650 mg total) by mouth every 6 (six) hours as needed (for pain scale < 4  OR  temperature  >/=  100.5 F). 06/23/20   McVey, Rebecca A, CNM  cetirizine (ZYRTEC) 10 MG tablet Take 10 mg by mouth daily. Patient not taking: No sig reported    [provider]  docusate sodium (COLACE) 100 MG capsule Take 1 capsule (100 mg total) by mouth 2 (two) times daily as needed for mild constipation. Patient not taking: Reported on 12/12/2020 06/23/20   McVey, Prudencio Pair, CNM  famotidine (PEPCID) 20 MG tablet Take 1 tablet (20 mg total) by mouth 2 (two) times daily. Patient not taking: Reported on 12/12/2020 06/23/20   McVey, Prudencio Pair, CNM  melatonin 5 MG TABS Take 1 tablet (5 mg total) by mouth at bedtime as needed (sleep). Patient not taking: Reported on 12/12/2020 06/23/20   McVey, Prudencio Pair, CNM  NIFEdipine (ADALAT CC) 60 MG 24 hr tablet Take 1 tablet (60 mg total) by mouth daily. 12/30/20   Haroldine Laws, CNM  ondansetron (ZOFRAN-ODT) 4 MG disintegrating tablet Take 1 tablet (4 mg total) by mouth every 6 (six) hours. 06/23/20   McVey, Prudencio Pair, CNM  oxyCODONE (OXY IR/ROXICODONE) 5 MG immediate release tablet Take 1 tablet (5 mg total) by mouth every 4 (four) hours as needed for moderate pain. 12/29/20   Haroldine Laws, CNM  pantoprazole (PROTONIX) 40 MG tablet Take 1 tablet (40 mg total) by mouth daily. 12/29/20   Haroldine Laws, CNM  Prenatal Vit-Fe Fumarate-FA (PRENATAL MULTIVITAMIN) TABS tablet Take 1 tablet by mouth daily at 12  noon.    [provider]  promethazine (PHENERGAN) 12.5 MG tablet Take 1-2 tablets (12.5-25 mg total) by mouth every 4 (four) hours as needed for nausea or vomiting. Patient not taking: Reported on 12/12/2020 06/23/20   McVey, Prudencio Pair, CNM  sertraline (ZOLOFT) 25 MG tablet Take 1 tablet (25 mg total) by mouth daily. 06/23/20 07/23/20  McVey, Prudencio Pair, CNM  triamcinolone ointment (KENALOG) 0.1 % Apply topically 2 (two) times daily. Patient  not taking: Reported on 12/12/2020 06/23/20   McVey, Prudencio Pair, CNM  amphetamine-dextroamphetamine (ADDERALL XR) 15 MG 24 hr capsule Take 1 capsule by mouth daily with breakfast. 02/03/16 05/23/20  Doug Sou, MD  FLUoxetine (PROZAC) 20 MG capsule Take 1 capsule (20 mg total) by mouth daily. 02/03/16 05/24/20  Doug Sou, MD  risperiDONE (RISPERDAL) 0.5 MG tablet Take 1 tablet (0.5 mg total) by mouth daily. 02/03/16 05/24/20  Doug Sou, MD     Allergies Patient has no known allergies.  Family History  Problem Relation Age of Onset   Schizophrenia Mother    Alcohol abuse Mother    Schizophrenia Father    Alcohol abuse Father     Social History Social History   Tobacco Use   Smoking status: Never   Smokeless tobacco: Never  Vaping Use   Vaping Use: Former  Substance Use Topics   Alcohol use: Not Currently   Drug use: Not Currently    Types: Marijuana    Review of Systems  Constitutional: No fever/chills Eyes: No visual changes.  ENT: No sore throat. Cardiovascular: Denies chest pain. Respiratory: Denies shortness of breath. Gastrointestinal: Vomiting as above Genitourinary: Negative for dysuria. Musculoskeletal: Negative for back pain. Skin: Negative for rash. Neurological: Negative for headaches or weakness   ____________________________________________   PHYSICAL EXAM:  VITAL SIGNS: ED Triage Vitals  Enc Vitals Group     BP 06/29/21 1918 (!) 110/59     Pulse Rate 06/29/21 1918 60     Resp 06/29/21 1918 17     Temp 06/29/21 1918 98.8 F (37.1 C)     Temp Source 06/29/21 1918 Oral     SpO2 06/29/21 1918 96 %     Weight 06/29/21 1809 59 kg (130 lb)     Height 06/29/21 1809 1.524 m (5')     Head Circumference --      Peak Flow --      Pain Score 06/29/21 1808 7     Pain Loc --      Pain Edu? --      Excl. in GC? --     Constitutional: Alert and oriented. Eyes: Conjunctivae are normal.  Head: Atraumatic.  Cardiovascular: Normal rate, regular  rhythm. Grossly normal heart sounds.  Good peripheral circulation. Respiratory: Normal respiratory effort.  No retractions. Lungs CTAB. Gastrointestinal: Soft and nontender. No distention.  No CVA tenderness.  Musculoskeletal: No lower extremity tenderness nor edema.  Warm and well perfused Neurologic:  Normal speech and language. No gross focal neurologic deficits are appreciated.  Skin:  Skin is warm, dry and intact. No rash noted. Psychiatric: Mood and affect are normal. Speech and behavior are normal.  ____________________________________________   LABS (all labs ordered are listed, but only abnormal results are displayed)  Labs Reviewed  COMPREHENSIVE METABOLIC PANEL - Abnormal; Notable for the following components:      Result Value   Sodium 134 (*)    Total Protein 8.3 (*)    All other components within normal limits  CBC -  Abnormal; Notable for the following components:   Platelets 403 (*)    All other components within normal limits  URINALYSIS, COMPLETE (UACMP) WITH MICROSCOPIC - Abnormal; Notable for the following components:   APPearance CLEAR (*)    Hgb urine dipstick TRACE (*)    Ketones, ur >160 (*)    Protein, ur 30 (*)    Leukocytes,Ua SMALL (*)    Bacteria, UA RARE (*)    All other components within normal limits  HCG, QUANTITATIVE, PREGNANCY - Abnormal; Notable for the following components:   hCG, Beta Chain, Quant, S 284,132 (*)    All other components within normal limits  LIPASE, BLOOD  POC URINE PREG, ED   ____________________________________________  EKG  None ____________________________________________  RADIOLOGY  Ultrasound ____________________________________________   PROCEDURES  Procedure(s) performed: No  Procedures   Critical Care performed: No ____________________________________________   INITIAL IMPRESSION / ASSESSMENT AND PLAN / ED COURSE  Pertinent labs & imaging results that were available during my care of the  patient were reviewed by me and considered in my medical decision making (see chart for details).   Patient presents with nausea and vomiting consistent with morning sickness.  Electrolytes are reassuring, will give IV fluids.  Review of records demonstrates ultrasound performed on August 28 discordant with dates, will repeat ultrasound today  Ultrasound today demonstrates 8-week IUP, subchorionic hemorrhage noted, patient is Rh+, she is not having any vaginal bleeding  She feels better after IV fluids, appropriate for discharge at this time with outpatient follow-up    ____________________________________________   FINAL CLINICAL IMPRESSION(S) / ED DIAGNOSES  Final diagnoses:  Nausea and vomiting in pregnancy        Note:  This document was prepared using Dragon voice recognition software and may include unintentional dictation errors.    Jene Every, MD 06/29/21 2233

## 2021-09-29 ENCOUNTER — Other Ambulatory Visit: Payer: Self-pay

## 2021-09-29 ENCOUNTER — Emergency Department
Admission: EM | Admit: 2021-09-29 | Discharge: 2021-09-29 | Disposition: A | Discharge status: Home / Self Care | Payer: Medicaid Other | Attending: Emergency Medicine | Admitting: Emergency Medicine

## 2021-09-29 DIAGNOSIS — Z20822 Contact with and (suspected) exposure to covid-19: Secondary | ICD-10-CM | POA: Diagnosis not present

## 2021-09-29 DIAGNOSIS — O98512 Other viral diseases complicating pregnancy, second trimester: Secondary | ICD-10-CM | POA: Diagnosis present

## 2021-09-29 DIAGNOSIS — O26892 Other specified pregnancy related conditions, second trimester: Secondary | ICD-10-CM | POA: Insufficient documentation

## 2021-09-29 DIAGNOSIS — B349 Viral infection, unspecified: Secondary | ICD-10-CM

## 2021-09-29 DIAGNOSIS — Z3A21 21 weeks gestation of pregnancy: Secondary | ICD-10-CM | POA: Insufficient documentation

## 2021-09-29 LAB — RESP PANEL BY RT-PCR (FLU A&B, COVID) ARPGX2
Influenza A by PCR: NEGATIVE
Influenza B by PCR: NEGATIVE
SARS Coronavirus 2 by RT PCR: NEGATIVE

## 2021-09-29 NOTE — ED Provider Notes (Signed)
[facility] Emergency Department Provider Note  ____________________________________________   None    (approximate)  I have reviewed the triage vital signs and the nursing notes.   HISTORY  Chief Complaint Cough   HPI Brianna Lloyd is a 20 y.o. female presents to the ED with concerns of cough, weakness and swollen lymph nodes for approximately 4 days.  Patient currently is [redacted] weeks pregnant.  She is also here with a 66-month-old with similar symptoms.  Mother denies any nausea, vomiting or diarrhea.  No known exposure to COVID or influenza.        Past Medical History:  Diagnosis Date   Aggressive behavior of adolescent    Medical history non-contributory     Patient Active Problem List   Diagnosis Date Noted   Cesarean delivery delivered 12/29/2020   Chronic anemia 12/29/2020   Maternal iron deficiency anemia affecting pregnancy in third trimester, antepartum 12/23/2020   Gestational hypertension w/o significant proteinuria in 3rd trimester 12/20/2020   Elevated blood pressure complicating pregnancy, antepartum 12/12/2020   Preeclampsia, third trimester 12/12/2020   Hyperemesis complicating pregnancy, antepartum 06/21/2020   Supervision of high risk pregnancy due to social problems in third trimester 06/09/2020   PTSD (post-traumatic stress disorder) 01/20/2016   ADHD (attention deficit hyperactivity disorder) 04/24/2013   Anxiety 11/05/2011    Past Surgical History:  Procedure Laterality Date   CESAREAN SECTION  12/27/2020   Procedure: CESAREAN SECTION;  Surgeon: Harlin Heys, MD;  Location: [facility] ORS;  Service: Obstetrics;;   NO PAST SURGERIES      Prior to Admission medications   Medication Sig Start Date End Date Taking? Authorizing Provider  acetaminophen (TYLENOL) 325 MG tablet Take 2 tablets (650 mg total) by mouth every 6 (six) hours as needed (for pain scale < 4  OR  temperature  >/=  100.5 F). 06/23/20   McVey, Rebecca A,  CNM  cetirizine (ZYRTEC) 10 MG tablet Take 10 mg by mouth daily. Patient not taking: No sig reported    [provider]  docusate sodium (COLACE) 100 MG capsule Take 1 capsule (100 mg total) by mouth 2 (two) times daily as needed for mild constipation. Patient not taking: Reported on 12/12/2020 06/23/20   McVey, Murray Hodgkins, CNM  famotidine (PEPCID) 20 MG tablet Take 1 tablet (20 mg total) by mouth 2 (two) times daily. Patient not taking: Reported on 12/12/2020 06/23/20   McVey, Murray Hodgkins, CNM  melatonin 5 MG TABS Take 1 tablet (5 mg total) by mouth at bedtime as needed (sleep). Patient not taking: Reported on 12/12/2020 06/23/20   McVey, Murray Hodgkins, CNM  NIFEdipine (ADALAT CC) 60 MG 24 hr tablet Take 1 tablet (60 mg total) by mouth daily. 12/30/20   Linda Hedges, CNM  ondansetron (ZOFRAN-ODT) 4 MG disintegrating tablet Take 1 tablet (4 mg total) by mouth every 6 (six) hours. 06/23/20   McVey, Murray Hodgkins, CNM  oxyCODONE (OXY IR/ROXICODONE) 5 MG immediate release tablet Take 1 tablet (5 mg total) by mouth every 4 (four) hours as needed for moderate pain. 12/29/20   Linda Hedges, CNM  pantoprazole (PROTONIX) 40 MG tablet Take 1 tablet (40 mg total) by mouth daily. 12/29/20   Linda Hedges, CNM  Prenatal Vit-Fe Fumarate-FA (PRENATAL MULTIVITAMIN) TABS tablet Take 1 tablet by mouth daily at 12 noon.    [provider]  promethazine (PHENERGAN) 12.5 MG tablet Take 1-2 tablets (12.5-25 mg total) by mouth every 4 (four) hours as needed for nausea or  vomiting. Patient not taking: Reported on 12/12/2020 06/23/20   McVey, Prudencio Pair, CNM  sertraline (ZOLOFT) 25 MG tablet Take 1 tablet (25 mg total) by mouth daily. 06/23/20 07/23/20  McVey, Prudencio Pair, CNM  triamcinolone ointment (KENALOG) 0.1 % Apply topically 2 (two) times daily. Patient not taking: Reported on 12/12/2020 06/23/20   McVey, Prudencio Pair, CNM  amphetamine-dextroamphetamine (ADDERALL XR) 15 MG 24 hr capsule Take 1 capsule by mouth daily with  breakfast. 02/03/16 05/23/20  Doug Sou, MD  FLUoxetine (PROZAC) 20 MG capsule Take 1 capsule (20 mg total) by mouth daily. 02/03/16 05/24/20  Doug Sou, MD  risperiDONE (RISPERDAL) 0.5 MG tablet Take 1 tablet (0.5 mg total) by mouth daily. 02/03/16 05/24/20  Doug Sou, MD    Allergies Patient has no known allergies.  Family History  Problem Relation Age of Onset   Schizophrenia Mother    Alcohol abuse Mother    Schizophrenia Father    Alcohol abuse Father     Social History Social History   Tobacco Use   Smoking status: Never   Smokeless tobacco: Never  Vaping Use   Vaping Use: Former  Substance Use Topics   Alcohol use: Not Currently   Drug use: Not Currently    Types: Marijuana    Review of Systems Constitutional: Subjective fever/chills Eyes: No visual changes. ENT: Positive sore throat. Cardiovascular: Denies chest pain. Respiratory: Denies shortness of breath.  Positive cough. Gastrointestinal: No abdominal pain.  No nausea, no vomiting.  No diarrhea.   Genitourinary: Negative for dysuria. Musculoskeletal: Positive for muscle aches. Skin: Negative for rash. Neurological: Negative for headaches, focal weakness or numbness. ____________________________________________   PHYSICAL EXAM:  VITAL SIGNS: ED Triage Vitals  Enc Vitals Group     BP 09/29/21 1304 (!) 112/56     Pulse Rate 09/29/21 1304 (!) 126     Resp 09/29/21 1304 18     Temp 09/29/21 1304 98.2 F (36.8 C)     Temp Source 09/29/21 1304 Oral     SpO2 09/29/21 1304 95 %     Weight 09/29/21 1305 123 lb (55.8 kg)     Height 09/29/21 1305 5' (1.524 m)     Head Circumference --      Peak Flow --      Pain Score 09/29/21 1304 5     Pain Loc --      Pain Edu? --      Excl. in GC? --     Constitutional: Alert and oriented. Well appearing and in no acute distress. Eyes: Conjunctivae are normal.  Head: Atraumatic. Nose: Mild congestion/rhinnorhea. Mouth/Throat: Mucous membranes are  moist.  Oropharynx non-erythematous.  No exudate.  No edema. Neck: No stridor.  Supple: No cervical lymphadenopathy noted. Cardiovascular: Normal rate, regular rhythm. Grossly normal heart sounds.  Good peripheral circulation. Respiratory: Normal respiratory effort.  No retractions. Lungs CTAB. Gastrointestinal: Soft and nontender. No distention. Musculoskeletal: Moves upper and lower extremities without any difficulty.  Normal gait was noted while in the emergency department and without assistance. Neurologic:  Normal speech and language. No gross focal neurologic deficits are appreciated.  Skin:  Skin is warm, dry and intact. No rash noted. Psychiatric: Mood and affect are normal. Speech and behavior are normal.  ____________________________________________   LABS (all labs ordered are listed, but only abnormal results are displayed)  Labs Reviewed  RESP PANEL BY RT-PCR (FLU A&B, COVID) [id number]    PROCEDURES  Procedure(s) performed (including Critical Care):  Procedures   ____________________________________________  INITIAL IMPRESSION / ASSESSMENT AND PLAN / ED COURSE  As part of my medical decision making, I reviewed the following data within the electronic MEDICAL RECORD NUMBER Notes from prior ED visits and [place] Controlled Substance Database  20 year old female presents to the ED with complaint of 4 days cough, weakness and sensation of her lymph nodes feeling swollen in her neck.  Patient is currently [redacted] weeks pregnant.  Patient denies any known exposure to  COVID or influenza.  She is also here with her 10-month-old daughter who is also being seen for same symptoms.  Both mother and daughter are negative for influenza and COVID.  Mother will check with her OB/GYN before continuing to take over-the-counter cough medication.  We discussed continuing with Tylenol if needed for muscle aches, headache or fever.  She was made aware that she cannot take ibuprofen while being pregnant.  She  is encouraged to increase fluids to stay hydrated.  She will return to the emergency department if any severe worsening of her symptoms.  ____________________________________________   FINAL CLINICAL IMPRESSION(S) / ED DIAGNOSES  Final diagnoses:  Viral illness     ED Discharge Orders     None        Note:  This document was prepared using Dragon voice recognition software and may include unintentional dictation errors.    Johnn Hai, PA-C 09/29/21 1516    Vladimir Crofts, MD 09/29/21 [phone]

## 2021-09-29 NOTE — ED Triage Notes (Signed)
Pt here with a cough, weakness, and swollen lymph nodes for 4 days. Pt is currently [redacted] weeks pregnant. Pt in NAD in triage.

## 2021-09-29 NOTE — Discharge Instructions (Addendum)
Follow-up with your primary care provider if any continued problems or concerns.  Increase fluids to stay hydrated.  Tylenol or ibuprofen as needed for body aches, headache or fever.

## 2021-10-22 DIAGNOSIS — O0990 Supervision of high risk pregnancy, unspecified, unspecified trimester: Secondary | ICD-10-CM | POA: Insufficient documentation

## 2022-01-06 ENCOUNTER — Other Ambulatory Visit: Payer: Medicaid Other

## 2022-01-06 ENCOUNTER — Other Ambulatory Visit: Payer: Self-pay

## 2022-01-06 DIAGNOSIS — O365990 Maternal care for other known or suspected poor fetal growth, unspecified trimester, not applicable or unspecified: Secondary | ICD-10-CM

## 2022-01-08 ENCOUNTER — Other Ambulatory Visit: Payer: Medicaid Other

## 2022-01-08 ENCOUNTER — Ambulatory Visit: Payer: Medicaid Other
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
## 2022-01-09 ENCOUNTER — Ambulatory Visit (HOSPITAL_BASED_OUTPATIENT_CLINIC_OR_DEPARTMENT_OTHER): Payer: Medicaid Other | Admitting: Obstetrics and Gynecology

## 2022-01-09 ENCOUNTER — Ambulatory Visit: Payer: Medicaid Other | Attending: Obstetrics and Gynecology

## 2022-01-09 ENCOUNTER — Ambulatory Visit: Payer: Medicaid Other | Admitting: *Deleted

## 2022-01-09 ENCOUNTER — Other Ambulatory Visit: Payer: Self-pay | Admitting: Obstetrics and Gynecology

## 2022-01-09 ENCOUNTER — Other Ambulatory Visit: Payer: Self-pay

## 2022-01-09 VITALS — BP 129/77 | HR 61

## 2022-01-09 DIAGNOSIS — O365931 Maternal care for other known or suspected poor fetal growth, third trimester, fetus 1: Secondary | ICD-10-CM

## 2022-01-09 DIAGNOSIS — Z363 Encounter for antenatal screening for malformations: Secondary | ICD-10-CM

## 2022-01-09 DIAGNOSIS — O34219 Maternal care for unspecified type scar from previous cesarean delivery: Secondary | ICD-10-CM | POA: Insufficient documentation

## 2022-01-09 DIAGNOSIS — O365990 Maternal care for other known or suspected poor fetal growth, unspecified trimester, not applicable or unspecified: Secondary | ICD-10-CM | POA: Diagnosis present

## 2022-01-09 DIAGNOSIS — Z3A35 35 weeks gestation of pregnancy: Secondary | ICD-10-CM

## 2022-01-09 DIAGNOSIS — O09293 Supervision of pregnancy with other poor reproductive or obstetric history, third trimester: Secondary | ICD-10-CM

## 2022-01-09 DIAGNOSIS — D649 Anemia, unspecified: Secondary | ICD-10-CM

## 2022-01-09 DIAGNOSIS — O365930 Maternal care for other known or suspected poor fetal growth, third trimester, not applicable or unspecified: Secondary | ICD-10-CM | POA: Diagnosis present

## 2022-01-09 DIAGNOSIS — O99013 Anemia complicating pregnancy, third trimester: Secondary | ICD-10-CM | POA: Diagnosis not present

## 2022-01-09 NOTE — Progress Notes (Signed)
Maternal-Fetal Medicine  ? ?Name: Brianna Lloyd ?DOB: 07-18-2001 ?KWI:[id number] ?Referring Provider: Christeen Douglas, MD ? ?I had the pleasure of seeing Ms. Harmsen today. She is here for a second opinion. At your office ultrasound, fetal growth restriction was diagnosed.  ?Her prenatal course has, otherwise, been uneventful. Patient reports she had low risk for fetal aneuploidies on screening.  She does not have gestational diabetes. ?Obstetric history significant for a cesarean delivery March 2022 at [redacted] weeks gestation.  Her pregnancy was complicated by preeclampsia.  She delivered a female infant weighing 4 pounds 11 ounces at birth.  Her daughter is in good health. ?GYN history: No history of abnormal Pap smears or cervical surgeries. ?Past medical history: No history of diabetes or hypertension. ?Past surgical history: Cesarean section. ?Medications: Prenatal vitamins. ?Allergies: No known drug allergies. ?Social history: Denies tobacco or drug or alcohol use.  Her partner is African-American with the father of her first child ?Family history: No history of venous thromboembolism in the family. ? ?Ultrasound ?On today's ultrasound, the estimated fetal weight is at the 1st percentile.  Abdominal circumference measurement is at the 1st percentile.  Head circumference measurement is at between -2 and -3 SD.  Amniotic fluid is normal and good fetal activity seen.  Cephalic presentation.  Umbilical artery Doppler showed normal forward diastolic flow.  NST is reactive.  BPP 10/10. ? ?Blood pressure today at her office is 129/77 mmHg. ? ?Fetal growth restriction ?I explained the finding of fetal growth restriction that is difficult to differentiate from a constitutionally small for gestational age fetus. ?I discussed the possible causes of fetal growth restriction including placental insufficiency (most common cause), chromosomal anomalies and fetal infections.  Patient did not give history of fever or  rashes. ? ?Timing of delivery: Because of fetal growth restriction, I recommend delivery at [redacted] weeks gestation.  I counseled the patient that fetal growth restriction is associated with increased perinatal mortality and morbidity rates. ? ?Recommendations ?-Patient reports she has an appointment for antenatal testing at your office on 01/13/2022. ?-She will be undergoing repeat cesarean delivery on 01/19/2022. ? ?Thank you for consultation.  If you have any questions or concerns, please contact me the [facility].  Consultation including face-to-face counseling 30 minutes. ? ? ? ? ?

## 2022-01-09 NOTE — Procedures (Signed)
Brianna Lloyd ?2001/09/15 ?[redacted]w[redacted]d ? ?Fetus A Non-Stress Test Interpretation for 01/09/22 ? ?Indication: IUGR ? ?Fetal Heart Rate A ?Mode: External ?Baseline Rate (A): 130 bpm ?Variability: Moderate ?Accelerations: 15 x 15 ?Decelerations: None ?Multiple birth?: No ? ?Uterine Activity ?Mode: Palpation, Toco ?Contraction Frequency (min): none ?Resting Tone Palpated: Relaxed ? ?Interpretation (Fetal Testing) ?Nonstress Test Interpretation: Reactive ?Overall Impression: Reassuring for gestational age ?Comments: Dr. Judeth Cornfield reviewed tracing ? ? ?

## 2022-01-14 ENCOUNTER — Other Ambulatory Visit
Admission: RE | Admit: 2022-01-14 | Discharge: 2022-01-14 | Disposition: A | Discharge status: Home / Self Care | Payer: Medicaid Other | Source: Ambulatory Visit | Attending: Obstetrics and Gynecology | Admitting: Obstetrics and Gynecology

## 2022-01-14 NOTE — Patient Instructions (Signed)
?Your procedure is scheduled on: Monday January 19, 2022.  ?Report to Labor and Delivery on the Third floor at the time given to you 8 am.  ? ?Remember: Instructions that are not followed completely may result in serious medical risk,  ?up to and including death, or upon the discretion of your surgeon and anesthesiologist your  ?surgery may need to be rescheduled.  ? ?  _X__ 1. Do not eat food or drink fluids after midnight the night before your procedure. ?                No chewing gum or hard candies.  ? ?__X__2.  On the morning of surgery brush your teeth with toothpaste and water, you ?               may rinse your mouth with mouthwash if you wish.  Do not swallow any toothpaste or mouthwash. ?   ? _X__ 3.  No Alcohol for 24 hours before or after surgery. ? ? _X__ 4.  Do Not Smoke or use e-cigarettes For 24 Hours Prior to Your Surgery. ?                Do not use any chewable tobacco products for at least 6 hours prior to ?                Surgery. ? ?_X__  5.  Do not use any recreational drugs (marijuana, cocaine, heroin, ecstasy, MDMA or other) ?               For at least one week prior to your surgery.  Combination of these drugs with anesthesia ?               May have life threatening results. ? ?____  6.  Bring all medications with you on the day of surgery if instructed.  ? ?__X__  7.  Notify your doctor if there is any change in your medical condition  ?    (cold, fever, infections). ?    ?Do not wear jewelry, make-up, hairpins, clips or nail polish. ?Do not wear lotions, powders, or perfumes. You may wear deodorant. ?Do not shave 48 hours prior to surgery. Men may shave face and neck. ?Do not bring valuables to the hospital.   ? ?[facility] is not responsible for any belongings or valuables. ? ?Contacts, dentures or bridgework may not be worn into surgery. ?Leave your suitcase in the car. After surgery it may be brought to your room. ?For patients admitted to the hospital, discharge  time is determined by your ?treatment team. ?  ?Patients discharged the day of surgery will not be allowed to drive home.   ?Make arrangements for someone to be with you for the first 24 hours of your ?Same Day Discharge. ? ? ?__X__ Take these medicines the morning of surgery with A SIP OF WATER:  ? ? 1. None  ? 2.  ? 3.  ? 4. ? 5. ? 6. ? ?____ Fleet Enema (as directed)  ? ?__X__ Use CHG Soap (or wipes) as directed ? ?____ Use Benzoyl Peroxide Gel as instructed ? ?____ Use inhalers on the day of surgery ? ?____ Stop metformin 2 days prior to surgery   ? ?____ Take 1/2 of usual insulin dose the night before surgery. No insulin the morning ?         of surgery.  ? ?____ Call your PCP, cardiologist, or Pulmonologist if taking Coumadin/Plavix/aspirin  and ask when to stop before your surgery.  ? ?__X__ One Week prior to surgery- Stop Anti-inflammatories such as Ibuprofen, Aleve, Advil, Motrin, meloxicam (MOBIC), diclofenac, etodolac, ketorolac, Toradol, Daypro, piroxicam, Goody's or BC powders. OK TO USE TYLENOL IF NEEDED ?  ?__X__ Stop supplements until after surgery.   ? ?____ Bring C-Pap to the hospital.  ? ? ?If you have any questions regarding your pre-procedure instructions,  ?Please call Pre-admit Testing at [phone] ? ?

## 2022-01-15 ENCOUNTER — Observation Stay: Payer: Medicaid Other | Admitting: Anesthesiology

## 2022-01-15 ENCOUNTER — Other Ambulatory Visit: Payer: Self-pay

## 2022-01-15 ENCOUNTER — Encounter: Payer: Self-pay | Admitting: Obstetrics and Gynecology

## 2022-01-15 ENCOUNTER — Encounter
Admission: EM | Disposition: A | Discharge status: Home / Self Care | Payer: Self-pay | Source: Home / Self Care | Attending: Obstetrics and Gynecology

## 2022-01-15 ENCOUNTER — Inpatient Hospital Stay
Admission: EM | Admit: 2022-01-15 | Discharge: 2022-01-18 | DRG: 787 | Disposition: A | Discharge status: Home / Self Care | Payer: Medicaid Other | Attending: Obstetrics and Gynecology | Admitting: Obstetrics and Gynecology

## 2022-01-15 ENCOUNTER — Observation Stay: Payer: Medicaid Other

## 2022-01-15 DIAGNOSIS — Z20822 Contact with and (suspected) exposure to covid-19: Secondary | ICD-10-CM | POA: Diagnosis present

## 2022-01-15 DIAGNOSIS — O365930 Maternal care for other known or suspected poor fetal growth, third trimester, not applicable or unspecified: Secondary | ICD-10-CM | POA: Diagnosis present

## 2022-01-15 DIAGNOSIS — O4690 Antepartum hemorrhage, unspecified, unspecified trimester: Principal | ICD-10-CM | POA: Diagnosis present

## 2022-01-15 DIAGNOSIS — O9081 Anemia of the puerperium: Secondary | ICD-10-CM | POA: Diagnosis not present

## 2022-01-15 DIAGNOSIS — D62 Acute posthemorrhagic anemia: Secondary | ICD-10-CM | POA: Diagnosis not present

## 2022-01-15 DIAGNOSIS — O1404 Mild to moderate pre-eclampsia, complicating childbirth: Secondary | ICD-10-CM | POA: Diagnosis present

## 2022-01-15 DIAGNOSIS — O4693 Antepartum hemorrhage, unspecified, third trimester: Secondary | ICD-10-CM | POA: Diagnosis present

## 2022-01-15 DIAGNOSIS — Z3A36 36 weeks gestation of pregnancy: Secondary | ICD-10-CM | POA: Diagnosis not present

## 2022-01-15 DIAGNOSIS — Z30430 Encounter for insertion of intrauterine contraceptive device: Secondary | ICD-10-CM | POA: Diagnosis not present

## 2022-01-15 DIAGNOSIS — O34211 Maternal care for low transverse scar from previous cesarean delivery: Secondary | ICD-10-CM | POA: Diagnosis present

## 2022-01-15 DIAGNOSIS — O0973 Supervision of high risk pregnancy due to social problems, third trimester: Secondary | ICD-10-CM

## 2022-01-15 DIAGNOSIS — O0993 Supervision of high risk pregnancy, unspecified, third trimester: Secondary | ICD-10-CM

## 2022-01-15 DIAGNOSIS — O99824 Streptococcus B carrier state complicating childbirth: Secondary | ICD-10-CM | POA: Diagnosis present

## 2022-01-15 HISTORY — PX: INTRAUTERINE DEVICE (IUD) INSERTION: SHX5877

## 2022-01-15 LAB — URINE DRUG SCREEN, QUALITATIVE (ARMC ONLY)
Amphetamines, Ur Screen: NOT DETECTED
Barbiturates, Ur Screen: NOT DETECTED
Benzodiazepine, Ur Scrn: NOT DETECTED
Cannabinoid 50 Ng, Ur ~~LOC~~: NOT DETECTED
Cocaine Metabolite,Ur ~~LOC~~: NOT DETECTED
MDMA (Ecstasy)Ur Screen: NOT DETECTED
Methadone Scn, Ur: NOT DETECTED
Opiate, Ur Screen: NOT DETECTED
Phencyclidine (PCP) Ur S: NOT DETECTED
Tricyclic, Ur Screen: NOT DETECTED

## 2022-01-15 LAB — COMPREHENSIVE METABOLIC PANEL
ALT: 10 U/L (ref 0–44)
AST: 14 U/L — ABNORMAL LOW (ref 15–41)
Albumin: 2.4 g/dL — ABNORMAL LOW (ref 3.5–5.0)
Alkaline Phosphatase: 95 U/L (ref 38–126)
Anion gap: 6 (ref 5–15)
BUN: 13 mg/dL (ref 6–20)
CO2: 25 mmol/L (ref 22–32)
Calcium: 8.2 mg/dL — ABNORMAL LOW (ref 8.9–10.3)
Chloride: 104 mmol/L (ref 98–111)
Creatinine, Ser: 0.57 mg/dL (ref 0.44–1.00)
GFR, Estimated: >60 mL/min (ref >60)
Glucose, Bld: 81 mg/dL (ref 70–99)
Potassium: 3.9 mmol/L (ref 3.5–5.1)
Sodium: 135 mmol/L (ref 135–145)
Total Bilirubin: 0.4 mg/dL (ref 0.3–1.2)
Total Protein: 6.2 g/dL — ABNORMAL LOW (ref 6.5–8.1)

## 2022-01-15 LAB — PROTEIN / CREATININE RATIO, URINE
Creatinine, Urine: 39 mg/dL
Protein Creatinine Ratio: 2.08 mg/mg{Cre} — ABNORMAL HIGH (ref 0.00–0.15)
Total Protein, Urine: 81 mg/dL

## 2022-01-15 LAB — RESP PANEL BY RT-PCR (FLU A&B, COVID) ARPGX2
Influenza A by PCR: NEGATIVE
Influenza B by PCR: NEGATIVE
SARS Coronavirus 2 by RT PCR: NEGATIVE

## 2022-01-15 LAB — URINALYSIS, ROUTINE W REFLEX MICROSCOPIC
Bacteria, UA: NONE SEEN
Bilirubin Urine: NEGATIVE
Glucose, UA: NEGATIVE mg/dL
Ketones, ur: NEGATIVE mg/dL
Leukocytes,Ua: NEGATIVE
Nitrite: NEGATIVE
Protein, ur: 100 mg/dL — AB
Specific Gravity, Urine: 1.01 (ref 1.005–1.030)
pH: 7 (ref 5.0–8.0)

## 2022-01-15 LAB — TYPE AND SCREEN
ABO/RH(D): B POS
Antibody Screen: NEGATIVE

## 2022-01-15 LAB — CBC
HCT: 29.6 % — ABNORMAL LOW (ref 36.0–46.0)
HCT: 30.6 % — ABNORMAL LOW (ref 36.0–46.0)
Hemoglobin: 9.2 g/dL — ABNORMAL LOW (ref 12.0–15.0)
Hemoglobin: 9.7 g/dL — ABNORMAL LOW (ref 12.0–15.0)
MCH: 27.3 pg (ref 26.0–34.0)
MCH: 27.8 pg (ref 26.0–34.0)
MCHC: 31.1 g/dL (ref 30.0–36.0)
MCHC: 31.7 g/dL (ref 30.0–36.0)
MCV: 87.8 fL (ref 80.0–100.0)
MCV: 87.7 fL (ref 80.0–100.0)
Platelets: 241 10*3/uL (ref 150–400)
Platelets: 251 10*3/uL (ref 150–400)
RBC: 3.37 MIL/uL — ABNORMAL LOW (ref 3.87–5.11)
RBC: 3.49 MIL/uL — ABNORMAL LOW (ref 3.87–5.11)
RDW: 14.1 % (ref 11.5–15.5)
RDW: 14.2 % (ref 11.5–15.5)
WBC: 9.2 10*3/uL (ref 4.0–10.5)
WBC: 7.6 10*3/uL (ref 4.0–10.5)
nRBC: 0.2 % (ref 0.0–0.2)
nRBC: 0.3 % — ABNORMAL HIGH (ref 0.0–0.2)

## 2022-01-15 LAB — WET PREP, GENITAL
Sperm: NONE SEEN
Trich, Wet Prep: NONE SEEN
WBC, Wet Prep HPF POC: <10 (ref <10)
Yeast Wet Prep HPF POC: NONE SEEN

## 2022-01-15 SURGERY — Surgical Case
Anesthesia: Spinal

## 2022-01-15 MED ORDER — 0.9 % SODIUM CHLORIDE (POUR BTL) OPTIME
TOPICAL | Status: DC | PRN
Start: 2022-01-15 — End: 2022-01-15
  Administered 2022-01-15: 450 mL

## 2022-01-15 MED ORDER — ZOLPIDEM TARTRATE 5 MG PO TABS: 5.0000 mg | ORAL_TABLET | Freq: Every evening | ORAL | Status: DC | PRN

## 2022-01-15 MED ORDER — ONDANSETRON HCL 4 MG/2ML IJ SOLN: 4.0000 mg | Freq: Three times a day (TID) | INTRAMUSCULAR | Status: DC | PRN

## 2022-01-15 MED ORDER — OXYTOCIN-SODIUM CHLORIDE 30-0.9 UT/500ML-% IV SOLN: 2.5000 [IU]/h | INTRAVENOUS | Status: AC

## 2022-01-15 MED ORDER — CALCIUM CARBONATE ANTACID 500 MG PO CHEW: 2.0000 | CHEWABLE_TABLET | ORAL | Status: DC | PRN

## 2022-01-15 MED ORDER — OXYTOCIN-SODIUM CHLORIDE 30-0.9 UT/500ML-% IV SOLN
INTRAVENOUS | Status: DC | PRN
  Administered 2022-01-15: 30 mL via INTRAVENOUS

## 2022-01-15 MED ORDER — DIPHENHYDRAMINE HCL 25 MG PO CAPS
25.0000 mg | ORAL_CAPSULE | ORAL | Status: DC | PRN
  Administered 2022-01-15: 25 mg via ORAL
  Filled 2022-01-15: qty 1

## 2022-01-15 MED ORDER — BUPIVACAINE HCL (PF) 0.5 % IJ SOLN
INTRAMUSCULAR | Status: DC | PRN
  Administered 2022-01-15: 100 mL

## 2022-01-15 MED ORDER — KETOROLAC TROMETHAMINE 30 MG/ML IJ SOLN: 30.0000 mg | Freq: Four times a day (QID) | INTRAMUSCULAR | Status: DC

## 2022-01-15 MED ORDER — NALOXONE HCL 0.4 MG/ML IJ SOLN: 0.4000 mg | INTRAMUSCULAR | Status: DC | PRN

## 2022-01-15 MED ORDER — OXYCODONE HCL 5 MG PO TABS: 10.0000 mg | ORAL_TABLET | ORAL | Status: AC | PRN

## 2022-01-15 MED ORDER — FENTANYL CITRATE (PF) 100 MCG/2ML IJ SOLN
INTRAMUSCULAR | Status: DC | PRN
Start: 2022-01-15 — End: 2022-01-15
  Administered 2022-01-15: 15 ug via INTRATHECAL

## 2022-01-15 MED ORDER — ACETAMINOPHEN 500 MG PO TABS
1000.0000 mg | ORAL_TABLET | Freq: Four times a day (QID) | ORAL | Status: AC
  Administered 2022-01-15 – 2022-01-16 (×4): 1000 mg via ORAL
  Filled 2022-01-15 (×4): qty 2

## 2022-01-15 MED ORDER — SALINE SPRAY 0.65 % NA SOLN: 1.0000 | NASAL | Status: DC | PRN

## 2022-01-15 MED ORDER — SODIUM CHLORIDE 0.9% FLUSH: 3.0000 mL | INTRAVENOUS | Status: DC | PRN

## 2022-01-15 MED ORDER — DIPHENHYDRAMINE HCL 50 MG/ML IJ SOLN
12.5000 mg | INTRAMUSCULAR | Status: DC | PRN
  Administered 2022-01-15: 12.5 mg via INTRAVENOUS
  Filled 2022-01-15: qty 1

## 2022-01-15 MED ORDER — ONDANSETRON HCL 4 MG/2ML IJ SOLN
INTRAMUSCULAR | Status: AC
  Filled 2022-01-15: qty 2

## 2022-01-15 MED ORDER — KETOROLAC TROMETHAMINE 30 MG/ML IJ SOLN
INTRAMUSCULAR | Status: AC
Start: 2022-01-15 — End: ?
  Filled 2022-01-15: qty 1

## 2022-01-15 MED ORDER — PRENATAL MULTIVITAMIN CH: 1.0000 | ORAL_TABLET | Freq: Every day | ORAL | Status: DC

## 2022-01-15 MED ORDER — ONDANSETRON HCL 4 MG/2ML IJ SOLN
INTRAMUSCULAR | Status: DC | PRN
  Administered 2022-01-15: 4 mg via INTRAVENOUS

## 2022-01-15 MED ORDER — OXYTOCIN-SODIUM CHLORIDE 30-0.9 UT/500ML-% IV SOLN
INTRAVENOUS | Status: AC
  Administered 2022-01-15: 2.5 [IU]/h via INTRAVENOUS
  Filled 2022-01-15: qty 1000

## 2022-01-15 MED ORDER — CEFAZOLIN SODIUM-DEXTROSE 2-4 GM/100ML-% IV SOLN
2.0000 g | INTRAVENOUS | Status: AC
  Administered 2022-01-15: 2 g via INTRAVENOUS
  Filled 2022-01-15: qty 100

## 2022-01-15 MED ORDER — MISOPROSTOL 200 MCG PO TABS
ORAL_TABLET | ORAL | Status: AC
  Filled 2022-01-15: qty 5

## 2022-01-15 MED ORDER — PHENYLEPHRINE HCL (PRESSORS) 10 MG/ML IV SOLN
INTRAVENOUS | Status: AC
  Filled 2022-01-15: qty 1

## 2022-01-15 MED ORDER — DOCUSATE SODIUM 100 MG PO CAPS: 100.0000 mg | ORAL_CAPSULE | Freq: Every day | ORAL | Status: DC

## 2022-01-15 MED ORDER — KETOROLAC TROMETHAMINE 30 MG/ML IJ SOLN
INTRAMUSCULAR | Status: DC | PRN
  Administered 2022-01-15: 30 mg via INTRAVENOUS

## 2022-01-15 MED ORDER — ACETAMINOPHEN 325 MG PO TABS: 650.0000 mg | ORAL_TABLET | ORAL | Status: DC | PRN

## 2022-01-15 MED ORDER — NIFEDIPINE ER OSMOTIC RELEASE 30 MG PO TB24
30.0000 mg | ORAL_TABLET | Freq: Every day | ORAL | Status: DC
Start: 2022-01-15 — End: 2022-01-17
  Administered 2022-01-15 – 2022-01-16 (×2): 30 mg via ORAL
  Filled 2022-01-15 (×3): qty 1

## 2022-01-15 MED ORDER — TRIAMCINOLONE ACETONIDE 40 MG/ML IJ SUSP
INTRAMUSCULAR | Status: DC | PRN
  Administered 2022-01-15: 40 mg via INTRAMUSCULAR

## 2022-01-15 MED ORDER — MORPHINE SULFATE (PF) 0.5 MG/ML IJ SOLN
INTRAMUSCULAR | Status: AC
  Filled 2022-01-15: qty 10

## 2022-01-15 MED ORDER — SODIUM CHLORIDE (PF) 0.9 % IJ SOLN
INTRAMUSCULAR | Status: AC
  Filled 2022-01-15: qty 50

## 2022-01-15 MED ORDER — LEVONORGESTREL 20.1 MCG/DAY IU IUD
1.0000 | INTRAUTERINE_SYSTEM | Freq: Once | INTRAUTERINE | Status: AC
Start: 2022-01-15 — End: 2022-01-15
  Administered 2022-01-15: 1 via INTRAUTERINE
  Filled 2022-01-15: qty 1

## 2022-01-15 MED ORDER — LACTATED RINGERS IV SOLN: INTRAVENOUS | Status: DC

## 2022-01-15 MED ORDER — MORPHINE SULFATE (PF) 0.5 MG/ML IJ SOLN
INTRAMUSCULAR | Status: DC | PRN
  Administered 2022-01-15: .1 mg via INTRATHECAL

## 2022-01-15 MED ORDER — BUPIVACAINE LIPOSOME 1.3 % IJ SUSP
INTRAMUSCULAR | Status: AC
  Filled 2022-01-15: qty 20

## 2022-01-15 MED ORDER — FENTANYL CITRATE (PF) 100 MCG/2ML IJ SOLN
INTRAMUSCULAR | Status: AC
  Filled 2022-01-15: qty 2

## 2022-01-15 MED ORDER — BUPIVACAINE IN DEXTROSE 0.75-8.25 % IT SOLN
INTRATHECAL | Status: DC | PRN
  Administered 2022-01-15: 1.5 mL via INTRATHECAL

## 2022-01-15 MED ORDER — NALOXONE HCL 4 MG/10ML IJ SOLN
1.0000 ug/kg/h | INTRAVENOUS | Status: DC | PRN
  Filled 2022-01-15: qty 5

## 2022-01-15 MED ORDER — OXYCODONE HCL 5 MG PO TABS
5.0000 mg | ORAL_TABLET | ORAL | Status: AC | PRN
  Administered 2022-01-15 – 2022-01-16 (×3): 5 mg via ORAL
  Filled 2022-01-15 (×3): qty 1

## 2022-01-15 MED ORDER — SCOPOLAMINE 1 MG/3DAYS TD PT72
1.0000 | MEDICATED_PATCH | Freq: Once | TRANSDERMAL | Status: AC
  Administered 2022-01-15: 1.5 mg via TRANSDERMAL
  Filled 2022-01-15: qty 1

## 2022-01-15 MED ORDER — BUPIVACAINE HCL (PF) 0.5 % IJ SOLN
INTRAMUSCULAR | Status: AC
Start: 2022-01-15 — End: 2022-01-15
  Filled 2022-01-15: qty 30

## 2022-01-15 MED ORDER — SOD CITRATE-CITRIC ACID 500-334 MG/5ML PO SOLN: 30.0000 mL | ORAL | Status: AC

## 2022-01-15 MED ORDER — SOD CITRATE-CITRIC ACID 500-334 MG/5ML PO SOLN
ORAL | Status: AC
  Administered 2022-01-15: 30 mL via ORAL
  Filled 2022-01-15: qty 15

## 2022-01-15 MED ORDER — PHENYLEPHRINE HCL-NACL 20-0.9 MG/250ML-% IV SOLN
INTRAVENOUS | Status: DC | PRN
Start: 2022-01-15 — End: 2022-01-15
  Administered 2022-01-15: 30 ug/min via INTRAVENOUS

## 2022-01-15 MED ORDER — POVIDONE-IODINE 10 % EX SWAB
2.0000 "application " | Freq: Once | CUTANEOUS | Status: AC
  Administered 2022-01-15: 2 via TOPICAL

## 2022-01-15 MED ORDER — TRIAMCINOLONE ACETONIDE 40 MG/ML IJ SUSP
40.0000 mg | Freq: Once | INTRAMUSCULAR | Status: DC
  Filled 2022-01-15: qty 1

## 2022-01-15 MED ORDER — ORAL CARE MOUTH RINSE: 15.0000 mL | Freq: Once | OROMUCOSAL | Status: AC

## 2022-01-15 MED ORDER — KETOROLAC TROMETHAMINE 30 MG/ML IJ SOLN
30.0000 mg | Freq: Four times a day (QID) | INTRAMUSCULAR | Status: DC
Start: 2022-01-15 — End: 2022-01-16
  Administered 2022-01-15 – 2022-01-16 (×3): 30 mg via INTRAVENOUS
  Filled 2022-01-15 (×3): qty 1

## 2022-01-15 MED ORDER — GUAIFENESIN ER 600 MG PO TB12
600.0000 mg | ORAL_TABLET | Freq: Two times a day (BID) | ORAL | Status: DC
  Administered 2022-01-15 – 2022-01-18 (×7): 600 mg via ORAL
  Filled 2022-01-15 (×8): qty 1

## 2022-01-15 MED ORDER — SALINE SPRAY 0.65 % NA SOLN
1.0000 | NASAL | Status: DC | PRN
  Administered 2022-01-15: 1 via NASAL
  Filled 2022-01-15: qty 44

## 2022-01-15 MED ORDER — CHLORHEXIDINE GLUCONATE 0.12 % MT SOLN
15.0000 mL | Freq: Once | OROMUCOSAL | Status: AC
  Administered 2022-01-15: 15 mL via OROMUCOSAL
  Filled 2022-01-15: qty 15

## 2022-01-15 MED ORDER — METHYLERGONOVINE MALEATE 0.2 MG/ML IJ SOLN
INTRAMUSCULAR | Status: AC
  Filled 2022-01-15: qty 1

## 2022-01-15 MED ORDER — CARBOPROST TROMETHAMINE 250 MCG/ML IM SOLN
INTRAMUSCULAR | Status: AC
  Filled 2022-01-15: qty 1

## 2022-01-15 SURGICAL SUPPLY — 37 items
BACTOSHIELD CHG 4% 4OZ (MISCELLANEOUS) ×1
CHLORAPREP W/TINT 26 (MISCELLANEOUS) ×2 IMPLANT
DRSG TELFA 3X8 NADH (GAUZE/BANDAGES/DRESSINGS) ×2 IMPLANT
ELECT REM PT RETURN 9FT ADLT (ELECTROSURGICAL) ×2
ELECTRODE REM PT RTRN 9FT ADLT (ELECTROSURGICAL) ×1 IMPLANT
GAUZE SPONGE 4X4 12PLY STRL (GAUZE/BANDAGES/DRESSINGS) ×2 IMPLANT
GLOVE SURG ENC MOIS LTX SZ7 (GLOVE) ×2 IMPLANT
GLOVE SURG UNDER LTX SZ7.5 (GLOVE) ×2 IMPLANT
GOWN STRL REUS W/ TWL LRG LVL3 (GOWN DISPOSABLE) ×3 IMPLANT
GOWN STRL REUS W/TWL LRG LVL3 (GOWN DISPOSABLE) ×3
KIT TURNOVER CYSTO (KITS) ×2 IMPLANT
Liletta  IUD ×1 IMPLANT
MANIFOLD NEPTUNE II (INSTRUMENTS) ×2 IMPLANT
MAT PREVALON FULL STRYKER (MISCELLANEOUS) ×2 IMPLANT
NDL HYPO 25GX1X1/2 BEV (NEEDLE) ×1 IMPLANT
NEEDLE HYPO 25GX1X1/2 BEV (NEEDLE) ×2 IMPLANT
NS IRRIG 1000ML POUR BTL (IV SOLUTION) ×2 IMPLANT
PACK C SECTION AR (MISCELLANEOUS) ×2 IMPLANT
PACK DNC HYST (MISCELLANEOUS) ×2 IMPLANT
PAD DRESSING TELFA 3X8 NADH (GAUZE/BANDAGES/DRESSINGS) ×1 IMPLANT
PAD OB MATERNITY 4.3X12.25 (PERSONAL CARE ITEMS) ×2 IMPLANT
PAD PREP 24X41 OB/GYN DISP (PERSONAL CARE ITEMS) ×2 IMPLANT
SCRUB CHG 4% DYNA-HEX 4OZ (MISCELLANEOUS) ×1 IMPLANT
SET CYSTO W/LG BORE CLAMP LF (SET/KITS/TRAYS/PACK) IMPLANT
SOL PREP PVP 2OZ (MISCELLANEOUS) ×2
SOLUTION PREP PVP 2OZ (MISCELLANEOUS) ×1 IMPLANT
SUT MNCRL 4-0 (SUTURE) ×1
SUT MNCRL 4-0 27XMFL (SUTURE) ×1
SUT VIC AB 0 CT1 36 (SUTURE) ×4 IMPLANT
SUT VIC AB 0 CTX 36 (SUTURE) ×2
SUT VIC AB 0 CTX36XBRD ANBCTRL (SUTURE) ×2 IMPLANT
SUT VIC AB 2-0 SH 27 (SUTURE) ×2
SUT VIC AB 2-0 SH 27XBRD (SUTURE) ×2 IMPLANT
SUTURE MNCRL 4-0 27XMF (SUTURE) ×1 IMPLANT
SYR 30ML LL (SYRINGE) ×4 IMPLANT
TOWEL OR 17X26 4PK STRL BLUE (TOWEL DISPOSABLE) ×2 IMPLANT
WATER STERILE IRR 500ML POUR (IV SOLUTION) ×2 IMPLANT
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
## 2022-01-15 NOTE — Anesthesia Procedure Notes (Signed)
Spinal ? ?Patient location during procedure: OR ?Reason for block: surgical anesthesia ?Staffing ?Performed: anesthesiologist and resident/CRNA  ?Anesthesiologist: Arita Miss, MD ?Resident/CRNA: Hedda Slade, CRNA ?Preanesthetic Checklist ?Completed: patient identified, IV checked, site marked, risks and benefits discussed, surgical consent, monitors and equipment checked, pre-op evaluation and timeout performed ?Spinal Block ?Patient position: sitting ?Prep: ChloraPrep and site prepped and draped ?Patient monitoring: heart rate, continuous pulse ox, blood pressure and cardiac monitor ?Approach: midline ?Location: L4-5 ?Injection technique: single-shot ?Needle ?Needle type: Whitacre and Introducer  ?Needle gauge: 24 G ?Needle length: 9 cm ?Assessment ?Sensory level: T10 ?Events: CSF return ?Additional Notes ?Multiple attempts by both Lorenza Chick CRNA and Bertell Maria MD, with successful placement by MD. ?Meticulous sterile technique used throughout (CHG prep, sterile gloves, sterile drape). Negative paresthesia. Negative blood return. Positive free-flowing CSF. Expiration date of kit checked and confirmed. Patient tolerated procedure well, without complications. ? ? ? ?
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
## 2022-01-15 NOTE — Discharge Summary (Signed)
Obstetrical Discharge Summary ? ?Patient Name: Brianna Lloyd ?DOB: 04-Nov-2000 ?MRN: [id number] ? ?Date of Admission: 01/15/2022 ?Date of Discharge: 01/18/22 ? ?Primary OB: [facility] ? ?Gestational Age at Delivery: [redacted]w[redacted]d  ? ?Antepartum complications:  ? ?- fetal growth restriction ?- PreE without severe features ?- desires contraception ?1. IUGR 1% ?2. Prior CS ?3. Hx of pituitary adenoma  ?4. Hx Preeclampsia with baseline P/C ratio .37 ?5. Iron deficiency anemia ? ?Admitting Diagnosis: vaginal bleeding ?Secondary Diagnosis: ?Patient Active Problem List  ? Diagnosis Date Noted  ? Supervision of high risk pregnancy in third trimester 01/16/2022  ? Vaginal bleeding during pregnancy, antepartum 01/15/2022  ? Delivery of pregnancy by cesarean section 01/15/2022  ? High-risk pregnancy 10/22/2021  ? Cesarean delivery delivered 12/29/2020  ? Chronic anemia 12/29/2020  ? Maternal iron deficiency anemia affecting pregnancy in third trimester, antepartum 12/23/2020  ? Gestational hypertension w/o significant proteinuria in 3rd trimester 12/20/2020  ? Elevated blood pressure complicating pregnancy, antepartum 12/12/2020  ? Preeclampsia, third trimester 12/12/2020  ? Hyperemesis complicating pregnancy, antepartum 06/21/2020  ? Supervision of high risk pregnancy due to social problems in third trimester 06/09/2020  ? PTSD (post-traumatic stress disorder) 01/20/2016  ? ADHD (attention deficit hyperactivity disorder) 04/24/2013  ? Anxiety 11/05/2011  ? ? ?Date of Delivery: 01/15/22 ?Delivered By: Christeen Douglas ?Delivery Type: repeat cesarean section, low transverse incision ?Anesthesia: spinal ?Placenta: Extracted ?Newborn Data: ?Live born female "[place]" ?Birth Weight: 3 lb 13 oz (1730 g) ?APGAR: 8, 9 ? ?Newborn Delivery   ?Birth date/time: 01/15/2022 11:06:00 ?Delivery type: C-Section, Low Transverse ?Trial of labor: No ?C-section categorization: Repeat ?  ?  ? ? ?Brief Hospital Course  ?(Cesarean Section): Brianna Lloyd is a G2P1001 who underwent cesarean section on 01/15/2022.  Patient had an uncomplicated surgery; for further details of this surgery, please refer to the operative note.  Patient had an uncomplicated postpartum course.  By time of discharge on POD#3, her pain was controlled on oral pain medications; she had appropriate lochia and was ambulating, voiding without difficulty, tolerating regular diet and passing flatus.   She was deemed stable for discharge to home.   ? ? ?Discharge Physical Exam:  ?BP 123/85 (BP Location: Left Arm)   Pulse 81   Temp 98.2 ?F (36.8 ?C) (Oral)   Resp 18   Ht 5' (1.524 m)   Wt 68 kg   LMP 04/21/2021 (Exact Date)   SpO2 100%   Breastfeeding Unknown   BMI 29.29 kg/m?  ? ?General: NAD ?CV: RRR ?Pulm: CTABL, nl effort ?ABD: s/nd/nt, fundus firm and below the umbilicus ?Lochia: moderate ?Incision: c/d/i  ?DVT Evaluation: LE non-ttp, no evidence of DVT on exam. ? ?Hemoglobin  ?Date Value Ref Range Status  ?01/16/2022 10.0 (L) 12.0 - 15.0 g/dL Final  ? ?HCT  ?Date Value Ref Range Status  ?01/16/2022 32.1 (L) 36.0 - 46.0 % Final  ? ? ?Post partum course: significant for preeclampsia, now on Procardia 60 mg Xl daily and blood pressures are WNL ?Postpartum Procedures:  ?Edinburgh:  ? ?  01/16/2022  ?  9:34 PM 12/28/2020  ?  8:00 AM 12/27/2020  ?  4:45 PM  ?Edinburgh Postnatal Depression Scale Screening Tool  ?I have been able to laugh and see the funny side of things. 1    ?I have looked forward with enjoyment to things. 1    ?I have blamed myself unnecessarily when things went wrong. 2    ?I have been anxious or worried for no good  reason. 1    ?I have felt scared or panicky for no good reason. 2    ?Things have been getting on top of me. 2    ?I have been so unhappy that I have had difficulty sleeping. 2    ?I have felt sad or miserable. 2    ?I have been so unhappy that I have been crying. 1    ?The thought of harming myself has occurred to me. 0    ?Edinburgh Postnatal Depression  Scale Total 14    ?  ? Information is confidential and restricted. Go to Review Flowsheets to unlock data.  ?  ?Disposition: stable, discharge to home. ?Baby Feeding: formula ?Baby Disposition: home with mom ? ?Rh Immune globulin given:immune ?Rubella vaccine given: immune ? ?Flu vaccine given in AP or PP setting: declined ?Tdap vaccine given in AP or PP setting: declined ? ? ?Contraception: IUD immediate placement ? ?Prenatal Labs: ?Blood type/Rh --/--/B POS (03/30 3235)  ?Antibody screen neg  ?Rubella Immune  ?Varicella Immune  ?RPR NR  ?HBsAg Neg  ?HIV NR  ?GC neg  ?Chlamydia neg  ?Genetic screening negative  ?1 hour GTT 67  ?3 hour GTT n/a  ?GBS pending  ? ? ? ?Plan:  ?Brianna Lloyd was discharged to home in good condition. ?Follow-up appointment at [facility]  with delivering provider in 2 weeks, postop and IUD strings check ? ? ?Discharge Medications: ?Allergies as of 01/18/2022   ?No Known Allergies ?  ? ?  ?Medication List  ?  ? ?STOP taking these medications   ? ?PRENATAL GUMMIES PO ?  ? ?  ? ?TAKE these medications   ? ?coconut oil Oil ?Apply 1 application. topically as needed. ?  ?dibucaine 1 % Oint ?Commonly known as: NUPERCAINAL ?Place 1 application. rectally as needed (postpartum hemorrhoids). ?  ?escitalopram 10 MG tablet ?Commonly known as: Lexapro ?Take 1 tablet (10 mg total) by mouth daily. ?  ?ferrous sulfate 325 (65 FE) MG tablet ?Take 1 tablet (325 mg total) by mouth 2 (two) times daily with a meal. ?  ?ibuprofen 600 MG tablet ?Commonly known as: ADVIL ?Take 1 tablet (600 mg total) by mouth every 6 (six) hours. ?  ?metroNIDAZOLE 500 MG tablet ?Commonly known as: Flagyl ?Take 1 tablet (500 mg total) by mouth 2 (two) times daily for 10 doses. Next dose due at 6pm ?  ?NIFEdipine 60 MG 24 hr tablet ?Commonly known as: ADALAT CC ?Take 1 tablet (60 mg total) by mouth daily. ?Start taking on: January 19, 2022 ?  ?oxyCODONE 5 MG immediate release tablet ?Commonly known as: Oxy  IR/ROXICODONE ?Take 1-2 tablets (5-10 mg total) by mouth every 4 (four) hours as needed for moderate pain. ?  ?senna-docusate 8.6-50 MG tablet ?Commonly known as: Senokot-S ?Take 2 tablets by mouth daily. ?Start taking on: January 19, 2022 ?  ?simethicone 80 MG chewable tablet ?Commonly known as: MYLICON ?Chew 1 tablet (80 mg total) by mouth as needed for flatulence. ?  ?witch hazel-glycerin pad ?Commonly known as: TUCKS ?Apply 1 application. topically as needed for hemorrhoids. ?  ? ?  ? ? ? Follow-up Information   ? ? Christeen Douglas, MD Follow up in 2 week(s).   ?Specialty: Obstetrics and Gynecology ?Why: For postop check ?Contact information: ?[address] ?[place] [address] ?[phone] ? ? ?  ?  ? ? [facility] Follow up in 3 day(s).   ?Why: BP check ?Contact information: ?[address] ?  [place] [id number] ?[phone] ? ?  ?  ? ?  ?  ? ?  ? ? ?Signed: ? ?Chari ManningFelicia Arrietty Dercole CNM ?
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
## 2022-01-15 NOTE — Progress Notes (Addendum)
Rn notified CNM, ANMD, and OB of maternal HR sustaining in 40s-50s and elevated BP. Pt in sinus bradycardia on telemetry. Pt asymptomatic at this time.  ?No headache, blurry vision, or other PIH symptoms. Pt maintaining adequate urinary output with minimal vaginal bleeding post-op.  ?Incentive spirometer given.  ?Pt alert and oriented x4.  ?No new orders.  ?

## 2022-01-15 NOTE — Op Note (Addendum)
?Cesarean Section Procedure Note ? ?Date of procedure: 02/02/2022  ? ?Pre-operative Diagnosis: Intrauterine pregnancy at [redacted]w[redacted]d;  ?- vaginal bleeding ?- fetal growth restriction ?- PreE without severe features ?- desires contraception ? ?Post-operative Diagnosis: same, delivered. ? ?Procedure: Repeat Low Transverse Cesarean Section through Pfannenstiel incision; revision of Keloid scar; postpartum placement of Liletta IUD ? ?Surgeon: Benjaman Kindler, MD ? ?Assistant(s):  Hassan Buckler, CNM ? ?Anesthesia: Spinal anesthesia ? ?Anesthesiologist: No responsible provider has been recorded for the case. Anesthesiologist: Arita Miss, MD ?CRNA: Hedda Slade, CRNA ? ?Estimated Blood Loss:   680 ?        ?Drains: FOLEY ?        ?Total IV Fluids: 884ml ? ?Urine Output: 81ml ?        ?Specimens: placenta ?        ?Complications:  None; patient tolerated the procedure well. ?        ?Disposition: PACU - hemodynamically stable. ?        ?Condition: stable ? ?Findings: ? A female infant in cephalic OP presentation. ?Amniotic fluid - Clear  ?Birth weight 1730 g.  ?Apgars of 8 and 9 at one and five minutes respectively.  ?Intact placenta with a three-vessel cord.  Grossly normal uterus, tubes and ovaries bilaterally. Dense intraabdominal adhesions were noted. ? ?Indications: previous uterine incision x1 ? ?Procedure Details  ?The patient was taken to Operating Room, identified as the correct patient and the procedure verified as C-Section Delivery. A formal Time Out was held with all team members present and in agreement. ? ?After induction of anesthesia, the patient was draped and prepped in the usual sterile manner. A Pfannenstiel skin incision was made and carried down through the subcutaneous tissue to the fascia. Fascial incision was made and extended transversely with the Mayo scissors. The fascia was separated from the underlying rectus tissue superiorly and inferiorly. The peritoneum was identified and entered bluntly.  Peritoneal incision was extended longitudinally. The utero-vesical peritoneal reflection was incised transversely and a bladder flap was created digitally.  ? ?A low transverse hysterotomy was made. The fetus was delivered atraumatically. The umbilical cord was clamped x2 and cut and the infant was handed to the awaiting pediatricians. The placenta was removed intact and appeared normal, intact, and with a 3-vessel cord.  ? ?The uterus was exteriorized and cleared of all clot and debris.  ? ?The Liletta IUD was placed at the fundus with ring forceps and the strings cut 10cm long, tucked into the cervix with the same forceps. ? ?The hysterotomy was closed with running sutures of 0-Vicryl. A second imbricating layer was placed with the same suture. Excellent hemostasis was observed. The peritoneal cavity was cleared of all clots and debris. The uterus was returned to the abdomen.  ? ?The pelvis was irrigated and again, excellent hemostasis was noted. The fascia was then reapproximated with running sutures of 0 Vicryl.  The subcutaneous tissue was reapproximated with running sutures of 0 Vicry. Her keloid scar was carefully excised, and the skin edges brought as closely together as the subcu stitch could manage. Prior incision closed with Vicryl, so monocryl used today. ? ?The skin was reapproximated with a 4-0 Monocryl subcuticular stitch, being sure to minimize any tension on the skin. 8ml (in 30 of 0.5% bupivicaine and 9ml of NSS) liposomal bupivicaine placed in the fascial and skin lines. 40mg  of 10mg /ml of Kenolog was infused along the incision line. Steri-strips and pressure dressing placed. ? ?Instrument, sponge, and needle counts were  correct prior to the abdominal closure and at the conclusion of the case.  ? ?The patient tolerated the procedure well and was transferred to the recovery room in stable condition.  ? ?Benjaman Kindler, MD ?02/02/2022  ?

## 2022-01-15 NOTE — Transfer of Care (Signed)
Immediate Anesthesia Transfer of Care Note ? ?Patient: Brianna Lloyd ? ?Procedure(s) Performed: REPEAT CESAREAN SECTION ?INTRAUTERINE DEVICE (IUD) INSERTION -LILETTA ? ?Patient Location: PACU ? ?Anesthesia Type:Spinal ? ?Level of Consciousness: awake, alert  and oriented ? ?Airway & Oxygen Therapy: Patient Spontanous Breathing ? ?Post-op Assessment: Report given to RN and Post -op Vital signs reviewed and stable ? ?Post vital signs: Reviewed and stable ? ?Last Vitals:  ?Vitals Value Taken Time  ?BP 122/84 01/15/22 1213  ?Temp 36.3 ?C 01/15/22 1213  ?Pulse 46 01/15/22 1213  ?Resp 14 01/15/22 1213  ?SpO2 99 % 01/15/22 1213  ? ? ?Last Pain:  ?Vitals:  ? 01/15/22 1213  ?TempSrc:   ?PainSc: 0-No pain  ?   ? ?  ? ?Complications: No notable events documented. ?

## 2022-01-15 NOTE — Progress Notes (Signed)
Nutrition Brief Note ? ?Patient identified on the Malnutrition Screening Tool (MST) Report ? ?Wt Readings from Last 15 Encounters:  ?01/15/22 68 kg  ?01/14/22 68 kg  ?09/29/21 55.8 kg  ?06/29/21 59 kg  ?06/15/21 70.3 kg  ?05/31/20 49.9 kg  ?05/24/20 49.9 kg  ?05/23/20 49.9 kg  ?05/14/19 59 kg (56 %, Z= 0.16)*  ? ?* Growth percentiles are based on CDC (Girls, 2-20 Years) data.  ? ?Zuria presents to L&D with complaints of vaginal bleeding that started around 0145. She reports waking up to go to the bathroom and realized she was bleeding when she wiped. ? ?Pt with new onset preeclampsia. Plan for delivery and IUD insertion after delivery.  ? ?Current diet order is NPO, patient is consuming approximately n/a% of meals at this time. Labs and medications reviewed.  ? ?No nutrition interventions warranted at this time. If nutrition issues arise, please consult RD.  ? ?Loistine Chance, RD, LDN, CDCES ?Registered Dietitian II ?Certified Diabetes Care and Education Specialist ?Please refer to [facility] for RD and/or RD on-call/weekend/after hours pager   ?

## 2022-01-15 NOTE — OB Triage Note (Signed)
Pt Brianna Lloyd 20 y.o. presents to labor and delivery triage reporting vaginal bleeding. Pt is a G2P1001 at [redacted]w[redacted]d. Pt denies signs and symptons consistent with rupture of membranes or active vaginal bleeding. Pt denies contractions and states positive fetal movement. External FM and TOCO applied to non-tender abdomen and assessing. Initial FHR 130. Vital signs obtained and within normal limits. Provider notified of pt. ? ?

## 2022-01-16 ENCOUNTER — Other Ambulatory Visit: Admission: RE | Admit: 2022-01-16 | Payer: Medicaid Other | Source: Ambulatory Visit

## 2022-01-16 ENCOUNTER — Encounter: Payer: Self-pay | Admitting: Obstetrics and Gynecology

## 2022-01-16 DIAGNOSIS — O0993 Supervision of high risk pregnancy, unspecified, third trimester: Secondary | ICD-10-CM

## 2022-01-16 LAB — BASIC METABOLIC PANEL
Anion gap: 7 (ref 5–15)
BUN: 10 mg/dL (ref 6–20)
CO2: 26 mmol/L (ref 22–32)
Calcium: 8.9 mg/dL (ref 8.9–10.3)
Chloride: 101 mmol/L (ref 98–111)
Creatinine, Ser: 0.51 mg/dL (ref 0.44–1.00)
GFR, Estimated: >60 mL/min (ref >60)
Glucose, Bld: 94 mg/dL (ref 70–99)
Potassium: 4.2 mmol/L (ref 3.5–5.1)
Sodium: 134 mmol/L — ABNORMAL LOW (ref 135–145)

## 2022-01-16 LAB — CBC
HCT: 32.1 % — ABNORMAL LOW (ref 36.0–46.0)
Hemoglobin: 10 g/dL — ABNORMAL LOW (ref 12.0–15.0)
MCH: 27.2 pg (ref 26.0–34.0)
MCHC: 31.2 g/dL (ref 30.0–36.0)
MCV: 87.5 fL (ref 80.0–100.0)
Platelets: 276 10*3/uL (ref 150–400)
RBC: 3.67 MIL/uL — ABNORMAL LOW (ref 3.87–5.11)
RDW: 14.1 % (ref 11.5–15.5)
WBC: 13.9 10*3/uL — ABNORMAL HIGH (ref 4.0–10.5)
nRBC: 0 % (ref 0.0–0.2)

## 2022-01-16 LAB — RPR: RPR Ser Ql: NONREACTIVE

## 2022-01-16 MED ORDER — OXYCODONE HCL 5 MG PO TABS: 5.0000 mg | ORAL_TABLET | ORAL | Status: DC | PRN

## 2022-01-16 MED ORDER — MENTHOL 3 MG MT LOZG
1.0000 | LOZENGE | OROMUCOSAL | Status: DC | PRN
  Filled 2022-01-16: qty 9

## 2022-01-16 MED ORDER — TETANUS-DIPHTH-ACELL PERTUSSIS 5-2.5-18.5 LF-MCG/0.5 IM SUSY
0.5000 mL | PREFILLED_SYRINGE | Freq: Once | INTRAMUSCULAR | Status: DC
  Filled 2022-01-16: qty 0.5

## 2022-01-16 MED ORDER — IBUPROFEN 600 MG PO TABS: 600.0000 mg | ORAL_TABLET | Freq: Four times a day (QID) | ORAL | Status: DC

## 2022-01-16 MED ORDER — BISACODYL 10 MG RE SUPP: 10.0000 mg | Freq: Every day | RECTAL | Status: DC | PRN

## 2022-01-16 MED ORDER — MEASLES, MUMPS & RUBELLA VAC IJ SOLR
0.5000 mL | Freq: Once | INTRAMUSCULAR | Status: DC
  Filled 2022-01-16: qty 0.5

## 2022-01-16 MED ORDER — IBUPROFEN 600 MG PO TABS
600.0000 mg | ORAL_TABLET | Freq: Four times a day (QID) | ORAL | Status: DC
  Administered 2022-01-17 – 2022-01-18 (×7): 600 mg via ORAL
  Filled 2022-01-16 (×7): qty 1

## 2022-01-16 MED ORDER — METRONIDAZOLE 500 MG PO TABS
500.0000 mg | ORAL_TABLET | Freq: Two times a day (BID) | ORAL | Status: DC
  Administered 2022-01-16 – 2022-01-18 (×5): 500 mg via ORAL
  Filled 2022-01-16 (×7): qty 1

## 2022-01-16 MED ORDER — DIPHENHYDRAMINE HCL 25 MG PO CAPS: 25.0000 mg | ORAL_CAPSULE | Freq: Four times a day (QID) | ORAL | Status: DC | PRN

## 2022-01-16 MED ORDER — SENNOSIDES-DOCUSATE SODIUM 8.6-50 MG PO TABS
2.0000 | ORAL_TABLET | ORAL | Status: DC
  Administered 2022-01-16 – 2022-01-18 (×3): 2 via ORAL
  Filled 2022-01-16 (×3): qty 2

## 2022-01-16 MED ORDER — FERROUS SULFATE 325 (65 FE) MG PO TABS
325.0000 mg | ORAL_TABLET | Freq: Two times a day (BID) | ORAL | Status: DC
  Administered 2022-01-16 – 2022-01-18 (×5): 325 mg via ORAL
  Filled 2022-01-16 (×5): qty 1

## 2022-01-16 MED ORDER — LACTATED RINGERS IV SOLN: INTRAVENOUS | Status: DC

## 2022-01-16 MED ORDER — GABAPENTIN 300 MG PO CAPS
300.0000 mg | ORAL_CAPSULE | Freq: Every day | ORAL | Status: DC
  Administered 2022-01-16 – 2022-01-17 (×2): 300 mg via ORAL
  Filled 2022-01-16 (×2): qty 1

## 2022-01-16 MED ORDER — ACETAMINOPHEN 500 MG PO TABS: 1000.0000 mg | ORAL_TABLET | Freq: Four times a day (QID) | ORAL | Status: DC

## 2022-01-16 MED ORDER — COCONUT OIL OIL
1.0000 "application " | TOPICAL_OIL | Status: DC | PRN
  Administered 2022-01-16: 1 via TOPICAL
  Filled 2022-01-16: qty 120

## 2022-01-16 MED ORDER — SIMETHICONE 80 MG PO CHEW
80.0000 mg | CHEWABLE_TABLET | Freq: Three times a day (TID) | ORAL | Status: DC
  Administered 2022-01-16 – 2022-01-18 (×8): 80 mg via ORAL
  Filled 2022-01-16 (×8): qty 1

## 2022-01-16 MED ORDER — PRENATAL MULTIVITAMIN CH
1.0000 | ORAL_TABLET | Freq: Every day | ORAL | Status: DC
  Administered 2022-01-16 – 2022-01-18 (×3): 1 via ORAL
  Filled 2022-01-16 (×3): qty 1

## 2022-01-16 MED ORDER — OXYCODONE HCL 5 MG PO TABS
5.0000 mg | ORAL_TABLET | ORAL | Status: DC | PRN
  Administered 2022-01-16: 5 mg via ORAL
  Administered 2022-01-17 – 2022-01-18 (×9): 10 mg via ORAL
  Filled 2022-01-16: qty 2
  Filled 2022-01-16: qty 1
  Filled 2022-01-16 (×8): qty 2

## 2022-01-16 MED ORDER — WITCH HAZEL-GLYCERIN EX PADS: 1.0000 "application " | MEDICATED_PAD | CUTANEOUS | Status: DC | PRN

## 2022-01-16 MED ORDER — DIBUCAINE (PERIANAL) 1 % EX OINT: 1.0000 "application " | TOPICAL_OINTMENT | CUTANEOUS | Status: DC | PRN

## 2022-01-16 MED ORDER — SIMETHICONE 80 MG PO CHEW: 80.0000 mg | CHEWABLE_TABLET | ORAL | Status: DC | PRN

## 2022-01-16 MED ORDER — KETOROLAC TROMETHAMINE 30 MG/ML IJ SOLN
30.0000 mg | Freq: Four times a day (QID) | INTRAMUSCULAR | Status: DC
  Administered 2022-01-16 (×2): 30 mg via INTRAVENOUS
  Filled 2022-01-16 (×2): qty 1

## 2022-01-16 MED ORDER — HYDROCORTISONE 1 % EX CREA
TOPICAL_CREAM | CUTANEOUS | Status: DC | PRN
  Filled 2022-01-16: qty 28

## 2022-01-16 MED ORDER — FLEET ENEMA 7-19 GM/118ML RE ENEM: 1.0000 | ENEMA | Freq: Every day | RECTAL | Status: DC | PRN
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
## 2022-01-16 NOTE — Anesthesia Postprocedure Evaluation (Signed)
Anesthesia Post Note ? ?Patient: Brianna Lloyd ? ?Procedure(s) Performed: REPEAT CESAREAN SECTION ?INTRAUTERINE DEVICE (IUD) INSERTION -LILETTA ? ?Patient location during evaluation: Mother Baby ?Anesthesia Type: Spinal ?Level of consciousness: oriented and awake and alert ?Pain management: pain level controlled ?Vital Signs Assessment: post-procedure vital signs reviewed and stable ?Respiratory status: spontaneous breathing and respiratory function stable ?Cardiovascular status: blood pressure returned to baseline and stable ?Postop Assessment: no headache, no backache, no apparent nausea or vomiting, able to ambulate and adequate PO intake ?Anesthetic complications: no ? ? ?No notable events documented. ? ? ?Last Vitals:  ?Vitals:  ? 01/16/22 0500 01/16/22 0600  ?BP:    ?Pulse: 67 (!) 56  ?Resp:    ?Temp:    ?SpO2: 95% 96%  ?  ?Last Pain:  ?Vitals:  ? 01/16/22 0400  ?TempSrc: Oral  ?PainSc:   ? ? ?  ?  ?  ?  ?  ?  ? ?Mathews Argyle P ? ? ? ? ?

## 2022-01-16 NOTE — Anesthesia Post-op Follow-up Note (Signed)
?  Anesthesia Pain Follow-up Note ? ?Patient: Brianna Lloyd ? ?Day #: 1 ? ?Date of Follow-up: 01/16/2022 Time: 7:35 AM ? ?Last Vitals:  ?Vitals:  ? 01/16/22 0500 01/16/22 0600  ?BP:    ?Pulse: 67 (!) 56  ?Resp:    ?Temp:    ?SpO2: 95% 96%  ? ? ?Level of Consciousness: alert ? ?Pain: mild  ? ?Side Effects:None ? ?Catheter Site Exam:clean ? ? ? ? ?Plan: D/C from anesthesia care at surgeon's request ? ?Mathews Argyle P ? ? ? ?

## 2022-01-16 NOTE — Progress Notes (Addendum)
Post Partum Day 1 ?Subjective: ?Doing well, no complaints.  Tolerating regular diet, pain with PO meds, voiding and ambulating without difficulty. ? ?No CP SOB Fever,Chills, N/V or leg pain; denies nipple or breast pain, no HA change of vision, RUQ/epigastric pain ? ?Objective: ?BP 134/81   Pulse 69   Temp 98.2 ?F (36.8 ?C) (Oral)   Resp 20   Ht 5' (1.524 m)   Wt 68 kg   LMP 04/21/2021 (Exact Date)   SpO2 98%   Breastfeeding Unknown   BMI 29.29 kg/m?  ?  ?Physical Exam:  ?General: NAD ?Breasts: soft/nontender ?CV: RRR ?Pulm: nl effort, CTABL ?Abdomen: soft, NT, BS x 4 ?Incision: Dsg CDI/Steristrips intact/no erythema or drainage ?Lochia: moderate ?Uterine Fundus: fundus firm and 1 fb below umbilicus ?DVT Evaluation: no cords, ttp LEs  ? ?Recent Labs  ?  01/15/22 ?3845 01/16/22 ?0743  ?HGB 9.7* 10.0*  ?HCT 30.6* 32.1*  ?WBC 7.6 13.9*  ?PLT 251 276  ? ? ?Assessment/Plan: ?21 y.o. [id number] postpartum day # 1 ? ?- Continue routine PP care ?- Bottle feeding ?- IUD placed during cesarean section ?- Acute blood loss anemia - hemodynamically stable and asymptomatic; start po ferrous sulfate BID with stool softeners  ? ?Disposition: Does not desire Dc home today.  ? ? ? ?Janyce Llanos, CNM ?01/16/2022 ?2:13 PM ? ? ? ? ?

## 2022-01-17 MED ORDER — NIFEDIPINE ER OSMOTIC RELEASE 30 MG PO TB24
60.0000 mg | ORAL_TABLET | Freq: Every day | ORAL | Status: DC
  Administered 2022-01-17 – 2022-01-18 (×2): 60 mg via ORAL
  Filled 2022-01-17 (×3): qty 2

## 2022-01-17 NOTE — Progress Notes (Signed)
Postop Day  2 ? ?Subjective: ?no complaints, up ad lib, voiding, tolerating PO, and + flatus ? ?Doing well, no concerns. Ambulating without difficulty, pain managed with PO meds, tolerating regular diet, and voiding without difficulty.  ? ?No fever/chills, chest pain, shortness of breath, nausea/vomiting, or leg pain. No nipple or breast pain. No headache, visual changes, or RUQ/epigastric pain. ? ?Objective: ?BP (!) 117/95 (BP Location: Left Arm)   Pulse 74   Temp 98.1 ?F (36.7 ?C) (Oral)   Resp 18   Ht 5' (1.524 m)   Wt 68 kg   LMP 04/21/2021 (Exact Date)   SpO2 97%   Breastfeeding Unknown   BMI 29.29 kg/m?  ?  ?Physical Exam:  ?General: alert, cooperative, and appears stated age ?Breasts: soft/nontender ?CV: RRR ?Pulm: nl effort, CTABL ?Abdomen: soft, non-tender, active bowel sounds ?Uterine Fundus: firm ?Incision: healing well, no significant drainage, no dehiscence, no significant erythema ?Perineum: minimal edema, intact ?Lochia: appropriate ?DVT Evaluation: No evidence of DVT seen on physical exam. ?Negative Homan's sign. ?No cords or calf tenderness. ?No significant calf/ankle edema. ? ?Recent Labs  ?  01/15/22 ?2947 01/16/22 ?0743  ?HGB 9.7* 10.0*  ?HCT 30.6* 32.1*  ?WBC 7.6 13.9*  ?PLT 251 276  ? ? ?Assessment/Plan: ?21 y.o. [id number] postpartum day # 2, up adlib actively participating in infant care. Patient reports feeling good with great pain control.  ? ?-Continue routine postpartum care ?-Increase Procardia to 60 mg daily for mild range bp ?-Encouraged snug fitting bra, cold application, Tylenol PRN, and cabbage leaves for engorgement for formula feeding  ?-IUD inserted during surgery  ?-Acute blood loss anemia - hemodynamically stable and asymptomatic; start PO ferrous sulfate BID with stool softeners  ?-Immunization status:   all immunizations up to date  ? ? ?Disposition: Continue inpatient postpartum care  ? LOS: 2 days  ? ?Warner Laduca LUCY Delmar [place], CNM ?01/17/2022, 10:16 AM   ? ?----- ?Chari Manning ?Certified Nurse Midwife ?[facility] ?[facility]  ?

## 2022-01-18 MED ORDER — NIFEDIPINE ER OSMOTIC RELEASE 60 MG PO TB24: 60.0000 mg | ORAL_TABLET | Freq: Every day | ORAL | 2 refills | Status: AC

## 2022-01-18 MED ORDER — NIFEDIPINE ER 60 MG PO TB24: 60.0000 mg | ORAL_TABLET | Freq: Every day | ORAL | Status: AC

## 2022-01-18 MED ORDER — FERROUS SULFATE 325 (65 FE) MG PO TABS: 325.0000 mg | ORAL_TABLET | Freq: Two times a day (BID) | ORAL | 3 refills | Status: AC

## 2022-01-18 MED ORDER — ESCITALOPRAM OXALATE 10 MG PO TABS: 10.0000 mg | ORAL_TABLET | Freq: Every day | ORAL | 0 refills | Status: AC

## 2022-01-18 MED ORDER — SIMETHICONE 80 MG PO CHEW: 80.0000 mg | CHEWABLE_TABLET | ORAL | 0 refills | Status: AC | PRN

## 2022-01-18 MED ORDER — SENNOSIDES-DOCUSATE SODIUM 8.6-50 MG PO TABS: 2.0000 | ORAL_TABLET | ORAL | Status: AC

## 2022-01-18 MED ORDER — IBUPROFEN 600 MG PO TABS: 600.0000 mg | ORAL_TABLET | Freq: Four times a day (QID) | ORAL | 0 refills | Status: AC

## 2022-01-18 MED ORDER — WITCH HAZEL-GLYCERIN EX PADS: 1.0000 "application " | MEDICATED_PAD | CUTANEOUS | 12 refills | Status: AC | PRN

## 2022-01-18 MED ORDER — OXYCODONE HCL 5 MG PO TABS: 5.0000 mg | ORAL_TABLET | ORAL | 0 refills | Status: AC | PRN

## 2022-01-18 MED ORDER — DIBUCAINE (PERIANAL) 1 % EX OINT: 1.0000 "application " | TOPICAL_OINTMENT | CUTANEOUS | Status: AC | PRN

## 2022-01-18 MED ORDER — METRONIDAZOLE 500 MG PO TABS: 500.0000 mg | ORAL_TABLET | Freq: Two times a day (BID) | ORAL | 0 refills | Status: AC

## 2022-01-18 MED ORDER — COCONUT OIL OIL: 1.0000 "application " | TOPICAL_OIL | 0 refills | Status: AC | PRN

## 2022-01-18 NOTE — Progress Notes (Signed)
Patient discharged home with infant. Rx sent to pharmacy. PP instructions given and reviewed with patient. Patient verbalized understanding. Patient escorted out by auxiliary.  ? ?Pt ensured RN that she has a way to the pharmacy and the means to purchase her rx. Dose of pain med and flagyl given to patient prior to being discharged. ? ?Peter Minium ?01/18/2022 ?5:33 PM ? ?

## 2022-01-19 ENCOUNTER — Inpatient Hospital Stay
Admission: RE | Admit: 2022-01-19 | Payer: Medicaid Other | Source: Home / Self Care | Admitting: Obstetrics and Gynecology

## 2022-01-19 DIAGNOSIS — O0973 Supervision of high risk pregnancy due to social problems, third trimester: Secondary | ICD-10-CM

## 2022-01-19 LAB — SURGICAL PATHOLOGY

## 2022-07-02 ENCOUNTER — Emergency Department
Admission: EM | Admit: 2022-07-02 | Discharge: 2022-07-02 | Disposition: A | Discharge status: Home / Self Care | Payer: Medicaid Other | Attending: Emergency Medicine | Admitting: Emergency Medicine

## 2022-07-02 ENCOUNTER — Other Ambulatory Visit: Payer: Self-pay

## 2022-07-02 ENCOUNTER — Encounter: Payer: Self-pay | Admitting: Intensive Care

## 2022-07-02 DIAGNOSIS — R300 Dysuria: Secondary | ICD-10-CM | POA: Insufficient documentation

## 2022-07-02 DIAGNOSIS — R350 Frequency of micturition: Secondary | ICD-10-CM | POA: Insufficient documentation

## 2022-07-02 LAB — URINALYSIS, ROUTINE W REFLEX MICROSCOPIC
Bilirubin Urine: NEGATIVE
Glucose, UA: NEGATIVE mg/dL
Hgb urine dipstick: NEGATIVE
Ketones, ur: NEGATIVE mg/dL
Leukocytes,Ua: NEGATIVE
Nitrite: NEGATIVE
Protein, ur: NEGATIVE mg/dL
Specific Gravity, Urine: 1.016 (ref 1.005–1.030)
pH: 8 (ref 5.0–8.0)

## 2022-07-02 LAB — CHLAMYDIA/NGC RT PCR (ARMC ONLY)
Chlamydia Tr: NOT DETECTED
N gonorrhoeae: NOT DETECTED

## 2022-07-02 LAB — WET PREP, GENITAL
Clue Cells Wet Prep HPF POC: NONE SEEN
Sperm: NONE SEEN
Trich, Wet Prep: NONE SEEN
WBC, Wet Prep HPF POC: <10 (ref <10)
Yeast Wet Prep HPF POC: NONE SEEN

## 2022-07-02 LAB — POC URINE PREG, ED: Preg Test, Ur: NEGATIVE

## 2022-07-02 MED ORDER — PHENAZOPYRIDINE HCL 200 MG PO TABS: 200.0000 mg | ORAL_TABLET | Freq: Three times a day (TID) | ORAL | 0 refills | Status: DC | PRN

## 2022-07-02 MED ORDER — PHENAZOPYRIDINE HCL 200 MG PO TABS: 200.0000 mg | ORAL_TABLET | Freq: Three times a day (TID) | ORAL | 0 refills | Status: AC | PRN

## 2022-07-02 NOTE — ED Provider Notes (Signed)
[facility] Provider Note    Event Date/Time   First MD Initiated Contact with Patient 07/02/22 1540     (approximate)   History   Urinary Frequency   HPI  Brianna Lloyd is a 21 y.o. female with history of high risk pregnancy  and as listed in EMR presents to the emergency department for evaluation of dysuria. Symptoms have been present for the past 3 weeks. No abdominal pain or fever. No noted hematuria. Reports vaginal discharge with recent unprotected intercourse with one female partner.       Physical Exam   Triage Vital Signs: ED Triage Vitals  Enc Vitals Group     BP 07/02/22 1519 113/65     Pulse Rate 07/02/22 1519 63     Resp 07/02/22 1519 16     Temp 07/02/22 1519 98.7 F (37.1 C)     Temp Source 07/02/22 1519 Oral     SpO2 07/02/22 1519 98 %     Weight 07/02/22 1504 124 lb (56.2 kg)     Height 07/02/22 1504 5' (1.524 m)     Head Circumference --      Peak Flow --      Pain Score 07/02/22 1504 7     Pain Loc --      Pain Edu? --      Excl. in GC? --     Most recent vital signs: Vitals:   07/02/22 1519  BP: 113/65  Pulse: 63  Resp: 16  Temp: 98.7 F (37.1 C)  SpO2: 98%    General: Awake, no distress.  CV:  Good peripheral perfusion.  Resp:  Normal effort.  Abd:  No distention. No abdominal tenderness Other:     ED Results / Procedures / Treatments   Labs (all labs ordered are listed, but only abnormal results are displayed) Labs Reviewed  URINALYSIS, ROUTINE W REFLEX MICROSCOPIC - Abnormal; Notable for the following components:      Result Value   Color, Urine YELLOW (*)    APPearance CLEAR (*)    All other components within normal limits  WET PREP, GENITAL  CHLAMYDIA/NGC RT PCR ([facility] ONLY)            POC URINE PREG, ED     EKG     RADIOLOGY  Image and radiology report reviewed by me.    PROCEDURES:  Critical Care performed: No  Procedures   MEDICATIONS ORDERED IN ED: Medications - No data  to display   IMPRESSION / MDM / ASSESSMENT AND PLAN / ED COURSE   I have reviewed the triage note.  Differential diagnosis includes, but is not limited to, acute cystitis, pyelonephritis, interstitial cystitis, vaginitis  21 year old female presenting to the emergency department for treatment and evaluation of dysuria.  See HPI for further details.  Exam is reassuring and urinalysis does not indicate UTI.  Patient states that she gets frequent bacterial vaginosis with symptoms similar to today.  Will send vaginal swabs for both wet prep and GC chlamydia.  Wet prep without indication of bacterial vaginosis, trichomonas, or candidiasis.  GC and Chlamydia is pending but the patient does not believe either of these will be positive.  She has 1 female partner who has not had any sign of infection.  No empiric treatment provided prior to discharge.  If positive, patient will be contacted and further advised on treatment.  Patient aware and agreeable to the plan.  ----------------------------------------- 6:36 PM on 07/02/2022 ----------------------------------------- Negative  for GC and chlamydia. No further treatment indicated.     FINAL CLINICAL IMPRESSION(S) / ED DIAGNOSES   Final diagnoses:  Dysuria     Rx / DC Orders   ED Discharge Orders          Ordered    phenazopyridine (PYRIDIUM) 200 MG tablet  3 times daily PRN,   Status:  Discontinued        07/02/22 1705    phenazopyridine (PYRIDIUM) 200 MG tablet  3 times daily PRN        07/02/22 1711             Note:  This document was prepared using Dragon voice recognition software and may include unintentional dictation errors.   Chinita Pester, FNP 07/02/22 1836    Concha Se, MD 07/04/22 [phone]

## 2022-07-02 NOTE — ED Triage Notes (Signed)
Patient c/o urinary frequency and burning during urination X3 weeks

## 2022-07-02 NOTE — Discharge Instructions (Addendum)
Be aware that the Pyridium will cause your urine to turn bright orange.  Your results do not indicate urinary tract infection or bacterial vaginosis.  Results of the chlamydia and gonorrhea screening is pending.  I will call you this evening with results if either test is positive.  Follow-up with your primary care provider or OB/GYN if not improving over the next few days.

## 2022-08-06 ENCOUNTER — Encounter (HOSPITAL_COMMUNITY): Payer: Self-pay | Admitting: Emergency Medicine

## 2022-08-06 ENCOUNTER — Other Ambulatory Visit: Payer: Self-pay

## 2022-08-06 ENCOUNTER — Emergency Department (HOSPITAL_COMMUNITY): Payer: Medicaid Other

## 2022-08-06 ENCOUNTER — Emergency Department (HOSPITAL_COMMUNITY)
Admission: EM | Admit: 2022-08-06 | Discharge: 2022-08-07 | Disposition: A | Discharge status: Home / Self Care | Payer: Medicaid Other | Attending: Emergency Medicine | Admitting: Emergency Medicine

## 2022-08-06 DIAGNOSIS — W010XXA Fall on same level from slipping, tripping and stumbling without subsequent striking against object, initial encounter: Secondary | ICD-10-CM | POA: Diagnosis not present

## 2022-08-06 DIAGNOSIS — M25511 Pain in right shoulder: Secondary | ICD-10-CM | POA: Diagnosis not present

## 2022-08-06 DIAGNOSIS — M25512 Pain in left shoulder: Secondary | ICD-10-CM | POA: Diagnosis not present

## 2022-08-06 DIAGNOSIS — M542 Cervicalgia: Secondary | ICD-10-CM | POA: Insufficient documentation

## 2022-08-06 DIAGNOSIS — S0990XA Unspecified injury of head, initial encounter: Secondary | ICD-10-CM | POA: Insufficient documentation

## 2022-08-06 DIAGNOSIS — T71193A Asphyxiation due to mechanical threat to breathing due to other causes, assault, initial encounter: Secondary | ICD-10-CM | POA: Diagnosis not present

## 2022-08-06 DIAGNOSIS — R6884 Jaw pain: Secondary | ICD-10-CM | POA: Diagnosis not present

## 2022-08-06 LAB — CBC WITH DIFFERENTIAL/PLATELET
Abs Immature Granulocytes: 0.04 10*3/uL (ref 0.00–0.07)
Basophils Absolute: 0 10*3/uL (ref 0.0–0.1)
Basophils Relative: 0 %
Eosinophils Absolute: 0.1 10*3/uL (ref 0.0–0.5)
Eosinophils Relative: 1 %
HCT: 36.1 % (ref 36.0–46.0)
Hemoglobin: 11.6 g/dL — ABNORMAL LOW (ref 12.0–15.0)
Immature Granulocytes: 1 %
Lymphocytes Relative: 20 %
Lymphs Abs: 1.7 10*3/uL (ref 0.7–4.0)
MCH: 28.6 pg (ref 26.0–34.0)
MCHC: 32.1 g/dL (ref 30.0–36.0)
MCV: 88.9 fL (ref 80.0–100.0)
Monocytes Absolute: 0.6 10*3/uL (ref 0.1–1.0)
Monocytes Relative: 7 %
Neutro Abs: 6.1 10*3/uL (ref 1.7–7.7)
Neutrophils Relative %: 71 %
Platelets: 380 10*3/uL (ref 150–400)
RBC: 4.06 MIL/uL (ref 3.87–5.11)
RDW: 14.1 % (ref 11.5–15.5)
WBC: 8.5 10*3/uL (ref 4.0–10.5)
nRBC: 0 % (ref 0.0–0.2)

## 2022-08-06 LAB — I-STAT VENOUS BLOOD GAS, ED
Acid-Base Excess: 0 mmol/L (ref 0.0–2.0)
Bicarbonate: 26.3 mmol/L (ref 20.0–28.0)
Calcium, Ion: 1.23 mmol/L (ref 1.15–1.40)
HCT: 36 % (ref 36.0–46.0)
Hemoglobin: 12.2 g/dL (ref 12.0–15.0)
O2 Saturation: 92 %
Potassium: 3.7 mmol/L (ref 3.5–5.1)
Sodium: 140 mmol/L (ref 135–145)
TCO2: 28 mmol/L (ref 22–32)
pCO2, Ven: 48 mmHg (ref 44–60)
pH, Ven: 7.347 (ref 7.25–7.43)
pO2, Ven: 69 mmHg — ABNORMAL HIGH (ref 32–45)

## 2022-08-06 LAB — I-STAT CHEM 8, ED
BUN: 16 mg/dL (ref 6–20)
Calcium, Ion: 1.23 mmol/L (ref 1.15–1.40)
Chloride: 103 mmol/L (ref 98–111)
Creatinine, Ser: 0.7 mg/dL (ref 0.44–1.00)
Glucose, Bld: 85 mg/dL (ref 70–99)
HCT: 37 % (ref 36.0–46.0)
Hemoglobin: 12.6 g/dL (ref 12.0–15.0)
Potassium: 3.6 mmol/L (ref 3.5–5.1)
Sodium: 140 mmol/L (ref 135–145)
TCO2: 26 mmol/L (ref 22–32)

## 2022-08-06 LAB — I-STAT BETA HCG BLOOD, ED (MC, WL, AP ONLY): I-stat hCG, quantitative: <5 m[IU]/mL (ref <5)

## 2022-08-06 MED ORDER — ACETAMINOPHEN 500 MG PO TABS
1000.0000 mg | ORAL_TABLET | Freq: Once | ORAL | Status: AC
  Administered 2022-08-06: 1000 mg via ORAL
  Filled 2022-08-06: qty 2

## 2022-08-06 MED ORDER — IOHEXOL 350 MG/ML SOLN
75.0000 mL | Freq: Once | INTRAVENOUS | Status: AC | PRN
  Administered 2022-08-06: 75 mL via INTRAVENOUS
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
## 2022-10-06 ENCOUNTER — Encounter: Payer: Self-pay | Admitting: Obstetrics and Gynecology

## 2023-07-06 ENCOUNTER — Ambulatory Visit (INDEPENDENT_AMBULATORY_CARE_PROVIDER_SITE_OTHER): Payer: Self-pay | Admitting: Family Medicine

## 2023-07-06 DIAGNOSIS — Z91199 Patient's noncompliance with other medical treatment and regimen due to unspecified reason: Secondary | ICD-10-CM

## 2023-07-06 NOTE — Progress Notes (Signed)
Patient was not seen for appt d/t no call, no show, or late arrival >10 mins past appt time.   Jacky Kindle, FNP  [facility] [address] [place], [place] [address] [phone] (phone) [phone] (fax) [facility]
